# Patient Record
Sex: Female | Born: 1980 | Race: Black or African American | Hispanic: No | Marital: Married | Smoking: Current every day smoker
Health system: Southern US, Community
[De-identification: ages and names within clinical notes are randomized; demographics above are authoritative.]

## PROBLEM LIST (undated history)

## (undated) DIAGNOSIS — J45909 Unspecified asthma, uncomplicated: Secondary | ICD-10-CM

## (undated) DIAGNOSIS — G43909 Migraine, unspecified, not intractable, without status migrainosus: Secondary | ICD-10-CM

## (undated) DIAGNOSIS — F319 Bipolar disorder, unspecified: Secondary | ICD-10-CM

---

## 2017-02-21 ENCOUNTER — Encounter (HOSPITAL_COMMUNITY): Payer: Self-pay | Admitting: Emergency Medicine

## 2017-02-21 ENCOUNTER — Ambulatory Visit (HOSPITAL_COMMUNITY)
Admission: EM | Admit: 2017-02-21 | Discharge: 2017-02-21 | Disposition: A | Discharge status: Home / Self Care | Payer: Medicaid Other | Attending: Internal Medicine | Admitting: Internal Medicine

## 2017-02-21 ENCOUNTER — Ambulatory Visit (INDEPENDENT_AMBULATORY_CARE_PROVIDER_SITE_OTHER): Payer: Medicaid Other

## 2017-02-21 DIAGNOSIS — R0789 Other chest pain: Secondary | ICD-10-CM | POA: Diagnosis not present

## 2017-02-21 DIAGNOSIS — R0782 Intercostal pain: Secondary | ICD-10-CM | POA: Diagnosis not present

## 2017-02-21 DIAGNOSIS — R05 Cough: Secondary | ICD-10-CM

## 2017-02-21 DIAGNOSIS — S39012A Strain of muscle, fascia and tendon of lower back, initial encounter: Secondary | ICD-10-CM | POA: Diagnosis not present

## 2017-02-21 DIAGNOSIS — M5489 Other dorsalgia: Secondary | ICD-10-CM | POA: Diagnosis not present

## 2017-02-21 DIAGNOSIS — T148XXA Other injury of unspecified body region, initial encounter: Secondary | ICD-10-CM

## 2017-02-21 HISTORY — DX: Bipolar disorder, unspecified: F31.9

## 2017-02-21 HISTORY — DX: Unspecified asthma, uncomplicated: J45.909

## 2017-02-21 HISTORY — DX: Migraine, unspecified, not intractable, without status migrainosus: G43.909

## 2017-02-21 MED ORDER — CYCLOBENZAPRINE HCL 10 MG PO TABS: 10.0000 mg | ORAL_TABLET | Freq: Two times a day (BID) | ORAL | 0 refills | Status: DC | PRN

## 2017-02-21 NOTE — ED Triage Notes (Signed)
Pt sts back and left rib pain x 1 week; pt denies injury but worse with movement and cough

## 2017-03-27 NOTE — ED Provider Notes (Signed)
[facility]    CSN: [id number] Arrival date & time: 02/21/17  1154     History   Chief Complaint Chief Complaint  Patient presents with  . Back Pain    HPI Deanna Green is a 37 y.o. female.   Pt c/o back pain x1 week. Cannot recall injuring herself. Denies urinary sx, fever, N/V/D.       Past Medical History:  Diagnosis Date  . Asthma   . Bipolar disorder (HCC)   . Migraine     There are no active problems to display for this patient.   History reviewed. No pertinent surgical history.  OB History    No data available       Home Medications    Prior to Admission medications   Medication Sig Start Date End Date Taking? Authorizing Provider  cyclobenzaprine (FLEXERIL) 10 MG tablet Take 1 tablet (10 mg total) by mouth 3 times/day as needed-between meals & bedtime for muscle spasms. 02/21/17   Arnaldo Nataliamond, Ezana Hubbert S, MD    Family History History reviewed. No pertinent family history.  Social History Social History   Tobacco Use  . Smoking status: Current Every Day Smoker  . Smokeless tobacco: Never Used  Substance Use Topics  . Alcohol use: Yes  . Drug use: No     Allergies   Sulfa antibiotics   Review of Systems Review of Systems  Constitutional: Negative for chills and fever.  HENT: Negative for sore throat and tinnitus.   Eyes: Negative for redness.  Respiratory: Negative for cough and shortness of breath.   Cardiovascular: Negative for chest pain and palpitations.  Gastrointestinal: Negative for abdominal pain, diarrhea, nausea and vomiting.  Genitourinary: Negative for dysuria, frequency and urgency.  Musculoskeletal: Positive for back pain. Negative for myalgias.  Skin: Negative for rash.       No lesions  Neurological: Negative for weakness.  Hematological: Does not bruise/bleed easily.  Psychiatric/Behavioral: Negative for suicidal ideas.     Physical Exam Triage Vital Signs ED Triage Vitals [02/21/17 1220]  Enc Vitals  Group     BP (!) 120/99     Pulse Rate 87     Resp 18     Temp 98.8 F (37.1 C)     Temp Source Oral     SpO2 100 %     Weight      Height      Head Circumference      Peak Flow      Pain Score 10     Pain Loc      Pain Edu?      Excl. in GC?    No data found.  Updated Vital Signs BP (!) 120/99 (BP Location: Right Arm)   Pulse 87   Temp 98.8 F (37.1 C) (Oral)   Resp 18   SpO2 100%   Visual Acuity Right Eye Distance:   Left Eye Distance:   Bilateral Distance:    Right Eye Near:   Left Eye Near:    Bilateral Near:     Physical Exam  Constitutional: She appears well-developed and well-nourished. No distress.  HENT:  Head: Normocephalic and atraumatic.  Eyes: Conjunctivae are normal.  Neck: Neck supple.  Cardiovascular: Normal rate and regular rhythm.  No murmur heard. Pulmonary/Chest: Effort normal and breath sounds normal. No respiratory distress.  Abdominal: Soft. There is no tenderness.  Musculoskeletal: She exhibits no edema.  Back pain reproducible with movement  Neurological: She is alert.  Skin: Skin  is warm and dry.  Psychiatric: She has a normal mood and affect.  Nursing note and vitals reviewed.    UC Treatments / Results  Labs (all labs ordered are listed, but only abnormal results are displayed) Labs Reviewed - No data to display  EKG  EKG Interpretation None       Radiology No results found.  Procedures Procedures (including critical care time)  Medications Ordered in UC Medications - No data to display   Initial Impression / Assessment and Plan / UC Course  I have reviewed the triage vital signs and the nursing notes.  Pertinent labs & imaging results that were available during my care of the patient were reviewed by me and considered in my medical decision making (see chart for details).     Pain is musculoskeletal.   Final Clinical Impressions(s) / UC Diagnoses   Final diagnoses:  Muscle strain    ED Discharge  Orders        Ordered    cyclobenzaprine (FLEXERIL) 10 MG tablet  2 times daily between meals and at bedtime PRN     02/21/17 1429       Controlled Substance Prescriptions [place] Controlled Substance Registry consulted? Not Applicable   Arnaldo Natal, MD 03/27/17 1510

## 2018-04-26 ENCOUNTER — Encounter (HOSPITAL_COMMUNITY): Payer: Self-pay | Admitting: Emergency Medicine

## 2018-04-26 ENCOUNTER — Ambulatory Visit (HOSPITAL_COMMUNITY)
Admission: EM | Admit: 2018-04-26 | Discharge: 2018-04-26 | Disposition: A | Discharge status: Home / Self Care | Payer: Medicaid Other | Attending: Family Medicine | Admitting: Family Medicine

## 2018-04-26 ENCOUNTER — Ambulatory Visit (INDEPENDENT_AMBULATORY_CARE_PROVIDER_SITE_OTHER): Payer: Medicaid Other

## 2018-04-26 DIAGNOSIS — R1084 Generalized abdominal pain: Secondary | ICD-10-CM

## 2018-04-26 LAB — POCT URINALYSIS DIP (DEVICE)
Glucose, UA: NEGATIVE mg/dL
KETONES UR: NEGATIVE mg/dL
LEUKOCYTE UA: NEGATIVE
Nitrite: NEGATIVE
PH: 6.5 (ref 5.0–8.0)
Protein, ur: NEGATIVE mg/dL
Specific Gravity, Urine: 1.025 (ref 1.005–1.030)
Urobilinogen, UA: 0.2 mg/dL (ref 0.0–1.0)

## 2018-04-26 NOTE — ED Provider Notes (Signed)
[facility]    CSN: [id number] Arrival date & time: 04/26/18  1402     History   Chief Complaint Chief Complaint  Patient presents with  . Abdominal Pain    HPI Deanna Green is a 38 y.o. female.    Pt c/o generalized upper abdominal pain since Monday. States when she eats food it seems to make it worse.  Reports rolling sharp pain that transitions to a dull ache associated with constipation (decrease from 3 BM's to 2 BM's a day) and decreased PO intake since Monday. Denies fever/ chills, urinary symptoms. Reports that pain is getting better. 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   Also reports migraine HA that has been ongoing for 3 weeks, typically broken by sleep. She reports extensive history of migraines with multiple therapy types for same.        Past Medical History:  Diagnosis Date  . Asthma   . Bipolar disorder (HCC)   . Migraine     There are no active problems to display for this patient.   History reviewed. No pertinent surgical history.  OB History   No obstetric history on file.      Home Medications    Prior to Admission medications   Not on File    Family History No family history on file.  Social History Social History   Tobacco Use  . Smoking status: Current Every Day Smoker  . Smokeless tobacco: Never Used  Substance Use Topics  . Alcohol use: Yes  . Drug use: No     Allergies   Strawberry (diagnostic) and Sulfa antibiotics   Review of Systems Review of Systems  Constitutional: Positive for appetite change. Negative for chills, fatigue and fever.  Eyes: Negative for photophobia.  Respiratory: Negative for chest tightness and shortness of breath.   Cardiovascular: Negative for chest pain.  Gastrointestinal: Positive for abdominal pain and constipation. Negative for abdominal distention, diarrhea,  nausea, rectal pain and vomiting.  Endocrine: Negative.   Genitourinary: Negative.   Musculoskeletal: Positive for arthralgias and back pain.  Skin: Negative.   Neurological: Positive for headaches. Negative for dizziness, facial asymmetry and speech difficulty.     Physical Exam Triage Vital Signs ED Triage Vitals  Enc Vitals Group     BP 04/26/18 1515 131/86     Pulse Rate 04/26/18 1515 83     Resp 04/26/18 1515 20     Temp 04/26/18 1515 98.2 F (36.8 C)     Temp Source 04/26/18 1515 Oral     SpO2 04/26/18 1515 95 %     Weight --      Height --      Head Circumference --      Peak Flow --      Pain Score 04/26/18 1516 7     Pain Loc --      Pain Edu? --      Excl. in GC? --    No data found.  Updated Vital Signs BP 131/86 (BP Location: Left Arm)   Pulse 83   Temp 98.2 F (36.8 C) (Oral)   Resp 20   LMP 04/21/2018   SpO2 95%    Physical Exam Vitals signs and nursing note reviewed.  Constitutional:      General: She is in acute distress.     Appearance: She is well-developed.  HENT:  Mouth/Throat:     Mouth: Mucous membranes are moist.     Pharynx: Oropharynx is clear.  Eyes:     Extraocular Movements: Extraocular movements intact.     Pupils: Pupils are equal, round, and reactive to light.  Neck:     Musculoskeletal: Normal range of motion and neck supple.  Cardiovascular:     Rate and Rhythm: Normal rate and regular rhythm.     Heart sounds: Normal heart sounds.  Pulmonary:     Effort: Pulmonary effort is normal.     Breath sounds: Normal breath sounds.  Abdominal:     General: Bowel sounds are normal.     Palpations: Abdomen is soft. There is no mass or pulsatile mass.     Tenderness: There is abdominal tenderness in the right upper quadrant, epigastric area and left upper quadrant. There is guarding.     Comments: Guarding to RUQ and LUQ of abd. Pain with palpation to RUQ and LUQ that is similar on both sides.  Musculoskeletal:     Comments:  Had difficulty moving to exam table due to soreness  Skin:    General: Skin is warm and dry.  Neurological:     General: No focal deficit present.     Mental Status: She is alert and oriented to person, place, and time.  Psychiatric:        Mood and Affect: Mood normal.        Behavior: Behavior normal.      UC Treatments / Results  Labs (all labs ordered are listed, but only abnormal results are displayed) Labs Reviewed  POCT URINALYSIS DIP (DEVICE) - Abnormal; Notable for the following components:      Result Value   Bilirubin Urine SMALL (*)    Hgb urine dipstick MODERATE (*)    All other components within normal limits    EKG None  Radiology Dg Abd 1 View  Result Date: 04/26/2018 CLINICAL DATA:  38 y/o F; constipation, irregular bowel movement, epigastric pain for 3 days. EXAM: ABDOMEN - 1 VIEW COMPARISON:  None. FINDINGS: The bowel gas pattern is normal. No radio-opaque calculi or other significant radiographic abnormality are seen. IMPRESSION: Negative. Electronically Signed   By: Mitzi Hansen M.D.   On: 04/26/2018 16:15    Procedures Procedures (including critical care time)  Medications Ordered in UC Medications - No data to display  Initial Impression / Assessment and Plan / UC Course  I have reviewed the triage vital signs and the nursing notes.  Pertinent labs & imaging results that were available during my care of the patient were reviewed by me and considered in my medical decision making (see chart for details).    Final Clinical Impressions(s) / UC Diagnoses   Final diagnoses:  Generalized abdominal pain     Discharge Instructions     Your tests are not conclusive.  Therefore you need more evaluation down the emergency room where further testing can be done.  Please go down to the emergency room this afternoon for further testing.    ED Prescriptions    None     Controlled Substance Prescriptions [place] Controlled Substance  Registry consulted? Not Applicable   Elvina Sidle, MD 04/26/18 1620

## 2018-04-26 NOTE — Discharge Instructions (Signed)
Your tests are not conclusive.  Therefore you need more evaluation down the emergency room where further testing can be done.  Please go down to the emergency room this afternoon for further testing.

## 2018-04-26 NOTE — ED Triage Notes (Signed)
Pt c/o generalized upper abdominal pain since Monday. States when she eats food it seems to make it worse.

## 2018-04-27 ENCOUNTER — Other Ambulatory Visit: Payer: Self-pay

## 2018-04-27 ENCOUNTER — Inpatient Hospital Stay (HOSPITAL_COMMUNITY)
Admission: EM | Admit: 2018-04-27 | Discharge: 2018-04-30 | DRG: 439 | Disposition: A | Discharge status: Home / Self Care | Payer: Medicaid Other | Attending: Family Medicine | Admitting: Family Medicine

## 2018-04-27 ENCOUNTER — Encounter (HOSPITAL_COMMUNITY): Payer: Self-pay

## 2018-04-27 ENCOUNTER — Emergency Department (HOSPITAL_COMMUNITY): Payer: Medicaid Other

## 2018-04-27 DIAGNOSIS — Z91018 Allergy to other foods: Secondary | ICD-10-CM

## 2018-04-27 DIAGNOSIS — E781 Pure hyperglyceridemia: Secondary | ICD-10-CM | POA: Diagnosis present

## 2018-04-27 DIAGNOSIS — F1010 Alcohol abuse, uncomplicated: Secondary | ICD-10-CM

## 2018-04-27 DIAGNOSIS — F319 Bipolar disorder, unspecified: Secondary | ICD-10-CM | POA: Diagnosis present

## 2018-04-27 DIAGNOSIS — R51 Headache: Secondary | ICD-10-CM | POA: Diagnosis not present

## 2018-04-27 DIAGNOSIS — Z882 Allergy status to sulfonamides status: Secondary | ICD-10-CM

## 2018-04-27 DIAGNOSIS — F0630 Mood disorder due to known physiological condition, unspecified: Secondary | ICD-10-CM

## 2018-04-27 DIAGNOSIS — J45909 Unspecified asthma, uncomplicated: Secondary | ICD-10-CM | POA: Diagnosis present

## 2018-04-27 DIAGNOSIS — G43909 Migraine, unspecified, not intractable, without status migrainosus: Secondary | ICD-10-CM | POA: Diagnosis present

## 2018-04-27 DIAGNOSIS — D649 Anemia, unspecified: Secondary | ICD-10-CM | POA: Diagnosis present

## 2018-04-27 DIAGNOSIS — G8929 Other chronic pain: Secondary | ICD-10-CM

## 2018-04-27 DIAGNOSIS — F17210 Nicotine dependence, cigarettes, uncomplicated: Secondary | ICD-10-CM | POA: Diagnosis present

## 2018-04-27 DIAGNOSIS — K852 Alcohol induced acute pancreatitis without necrosis or infection: Principal | ICD-10-CM | POA: Diagnosis present

## 2018-04-27 DIAGNOSIS — K859 Acute pancreatitis without necrosis or infection, unspecified: Secondary | ICD-10-CM

## 2018-04-27 DIAGNOSIS — F10188 Alcohol abuse with other alcohol-induced disorder: Secondary | ICD-10-CM | POA: Diagnosis present

## 2018-04-27 DIAGNOSIS — F17200 Nicotine dependence, unspecified, uncomplicated: Secondary | ICD-10-CM

## 2018-04-27 LAB — COMPREHENSIVE METABOLIC PANEL
ALK PHOS: 39 U/L (ref 38–126)
ALT: 14 U/L (ref 0–44)
ANION GAP: 7 (ref 5–15)
AST: 19 U/L (ref 15–41)
Albumin: 3.4 g/dL — ABNORMAL LOW (ref 3.5–5.0)
BILIRUBIN TOTAL: 0.5 mg/dL (ref 0.3–1.2)
BUN: 5 mg/dL — ABNORMAL LOW (ref 6–20)
CALCIUM: 8.6 mg/dL — AB (ref 8.9–10.3)
CO2: 19 mmol/L — ABNORMAL LOW (ref 22–32)
Chloride: 109 mmol/L (ref 98–111)
Creatinine, Ser: 0.7 mg/dL (ref 0.44–1.00)
GFR calc non Af Amer: >60 mL/min (ref >60)
Glucose, Bld: 101 mg/dL — ABNORMAL HIGH (ref 70–99)
Potassium: 3.8 mmol/L (ref 3.5–5.1)
SODIUM: 135 mmol/L (ref 135–145)
TOTAL PROTEIN: 5.9 g/dL — AB (ref 6.5–8.1)

## 2018-04-27 LAB — URINALYSIS, ROUTINE W REFLEX MICROSCOPIC
Bacteria, UA: NONE SEEN
Bilirubin Urine: NEGATIVE
Glucose, UA: NEGATIVE mg/dL
Ketones, ur: NEGATIVE mg/dL
Leukocytes,Ua: NEGATIVE
Nitrite: NEGATIVE
Protein, ur: NEGATIVE mg/dL
SPECIFIC GRAVITY, URINE: 1.016 (ref 1.005–1.030)
pH: 6 (ref 5.0–8.0)

## 2018-04-27 LAB — CBC WITH DIFFERENTIAL/PLATELET
Abs Immature Granulocytes: 0.04 10*3/uL (ref 0.00–0.07)
BASOS PCT: 0 %
Basophils Absolute: 0 10*3/uL (ref 0.0–0.1)
EOS ABS: 0.5 10*3/uL (ref 0.0–0.5)
EOS PCT: 5 %
HCT: 33.2 % — ABNORMAL LOW (ref 36.0–46.0)
Hemoglobin: 10.7 g/dL — ABNORMAL LOW (ref 12.0–15.0)
Immature Granulocytes: 0 %
Lymphocytes Relative: 12 %
Lymphs Abs: 1.2 10*3/uL (ref 0.7–4.0)
MCH: 29.8 pg (ref 26.0–34.0)
MCHC: 32.2 g/dL (ref 30.0–36.0)
MCV: 92.5 fL (ref 80.0–100.0)
MONO ABS: 0.5 10*3/uL (ref 0.1–1.0)
Monocytes Relative: 5 %
Neutro Abs: 8.1 10*3/uL — ABNORMAL HIGH (ref 1.7–7.7)
Neutrophils Relative %: 78 %
PLATELETS: 291 10*3/uL (ref 150–400)
RBC: 3.59 MIL/uL — AB (ref 3.87–5.11)
RDW: 13.6 % (ref 11.5–15.5)
WBC: 10.4 10*3/uL (ref 4.0–10.5)
nRBC: 0 % (ref 0.0–0.2)

## 2018-04-27 LAB — RAPID URINE DRUG SCREEN, HOSP PERFORMED
AMPHETAMINES: NOT DETECTED
Barbiturates: NOT DETECTED
Benzodiazepines: NOT DETECTED
Cocaine: NOT DETECTED
OPIATES: NOT DETECTED
TETRAHYDROCANNABINOL: POSITIVE — AB

## 2018-04-27 LAB — ETHANOL: Alcohol, Ethyl (B): <10 mg/dL (ref <10)

## 2018-04-27 LAB — LIPASE, BLOOD: LIPASE: 1488 U/L — AB (ref 11–51)

## 2018-04-27 MED ORDER — ENOXAPARIN SODIUM 40 MG/0.4ML ~~LOC~~ SOLN
40.0000 mg | SUBCUTANEOUS | Status: DC
  Filled 2018-04-27 (×2): qty 0.4

## 2018-04-27 MED ORDER — VITAMIN B-1 100 MG PO TABS
100.0000 mg | ORAL_TABLET | Freq: Every day | ORAL | Status: DC
  Administered 2018-04-27 – 2018-04-30 (×4): 100 mg via ORAL
  Filled 2018-04-27 (×4): qty 1

## 2018-04-27 MED ORDER — SODIUM CHLORIDE 0.9 % IV SOLN
INTRAVENOUS | Status: DC
  Administered 2018-04-27 – 2018-04-30 (×7): via INTRAVENOUS

## 2018-04-27 MED ORDER — ADULT MULTIVITAMIN W/MINERALS CH
1.0000 | ORAL_TABLET | Freq: Every day | ORAL | Status: DC
  Administered 2018-04-28 – 2018-04-30 (×3): 1 via ORAL
  Filled 2018-04-27 (×3): qty 1

## 2018-04-27 MED ORDER — HYDROMORPHONE HCL 1 MG/ML IJ SOLN
0.5000 mg | INTRAMUSCULAR | Status: DC | PRN
  Administered 2018-04-27 – 2018-04-28 (×4): 0.5 mg via INTRAVENOUS
  Filled 2018-04-27 (×4): qty 1

## 2018-04-27 MED ORDER — ONDANSETRON HCL 4 MG/2ML IJ SOLN
4.0000 mg | Freq: Once | INTRAMUSCULAR | Status: AC
  Administered 2018-04-27: 4 mg via INTRAVENOUS
  Filled 2018-04-27: qty 2

## 2018-04-27 MED ORDER — FOLIC ACID 1 MG PO TABS
1.0000 mg | ORAL_TABLET | Freq: Every day | ORAL | Status: DC
  Administered 2018-04-27 – 2018-04-30 (×4): 1 mg via ORAL
  Filled 2018-04-27 (×4): qty 1

## 2018-04-27 MED ORDER — SODIUM CHLORIDE 0.9 % IV BOLUS
1000.0000 mL | Freq: Once | INTRAVENOUS | Status: AC
  Administered 2018-04-27: 1000 mL via INTRAVENOUS

## 2018-04-27 MED ORDER — SODIUM CHLORIDE 0.9 % IV SOLN
INTRAVENOUS | Status: AC
  Administered 2018-04-27 – 2018-04-28 (×2): via INTRAVENOUS

## 2018-04-27 MED ORDER — ONDANSETRON HCL 4 MG PO TABS: 4.0000 mg | ORAL_TABLET | Freq: Four times a day (QID) | ORAL | Status: DC | PRN

## 2018-04-27 MED ORDER — THIAMINE HCL 100 MG/ML IJ SOLN: 100.0000 mg | Freq: Every day | INTRAMUSCULAR | Status: DC

## 2018-04-27 MED ORDER — LORAZEPAM 2 MG/ML IJ SOLN: 1.0000 mg | Freq: Four times a day (QID) | INTRAMUSCULAR | Status: AC | PRN

## 2018-04-27 MED ORDER — LORAZEPAM 1 MG PO TABS
1.0000 mg | ORAL_TABLET | Freq: Four times a day (QID) | ORAL | Status: AC | PRN
  Administered 2018-04-28: 1 mg via ORAL
  Filled 2018-04-27: qty 1

## 2018-04-27 MED ORDER — ONDANSETRON HCL 4 MG/2ML IJ SOLN: 4.0000 mg | Freq: Four times a day (QID) | INTRAMUSCULAR | Status: DC | PRN

## 2018-04-27 MED ORDER — FENTANYL CITRATE (PF) 100 MCG/2ML IJ SOLN
100.0000 ug | INTRAMUSCULAR | Status: AC | PRN
  Administered 2018-04-27 (×2): 100 ug via INTRAVENOUS
  Filled 2018-04-27 (×2): qty 2

## 2018-04-27 NOTE — Progress Notes (Signed)
Pt new admit from ED for abdominal pain, alert and oriented , independent, NPO with IV NSS  fluid, no complain of pain at this time.

## 2018-04-27 NOTE — H&P (Addendum)
Family Medicine Teaching [facility] Admission History and Physical Service Pager: [phone]  Patient name: Deanna Green Medical record number: [id number] Date of birth: [birth year] Age: 38 y.o. Gender: female  Primary Care Provider: Patient, No Pcp Per Consultants: none Code Status: full  Chief Complaint: epigastric pain  Assessment and Plan: Deanna Green is a 38 y.o. female presenting with 5 days of epigastric pain and found to have and elevated lipase consistent with acute pancreatitis . PMH is significant for alcohol use disorder, migraines,  possible bipolar disorder.    Acute Alcoholic Pancreatitis - 5 day history of epigastric pain.  Decreased appetite.  No nausea, vomiting or diarrhea. Daily alcohol user. No previous history of biliary cholic symptoms.  Does not use NSAIDs regularly. Lipase is 1488.  AST/ALT normal.  Abdominal u/s showed no gallstones, possible hepatic steatosis, mild dilation of pancreatic duct without definite stone. Patient afebrile, no leukocytosis, and VSS.  Patient likely has acute pancreatitis secondary to alcohol use.  Other causes of pancreatitis such as stones, hypertriglyceridemia, or chronic NSAID use less likely based on History. Will admit and control pain and slowly advance diet. Patient has had reactions to morphine and percocet.    - admit to inpatient, med surg. Dr. Pollie Meyer attending.  - NPO then advance diet as tolerated.  - 119ml/hr NS mIVF -  Dilaudid 0.5 mg q3h PRN, transition to po on on day 2 - zofran prn  - AM CBC and BMP - AM lipase - vitals per routine  Alcohol use disorder - patient states she drinks 2 beers daily and liquor at least once a week. Cannot remember the last time she went more than one day without drinking.  High risk for having withdrawals.  Will monitor CIWAs - CIWA scores.  Treat with ativan as stated in orders.  - thiamine daily - folic acid daily - consider social work consult  Chronic daily headaches - migraine -  patient states she has had daily migraines for 3 weeks.  Usually gets them for a week or two at a time before having a few days without.  No longer takes medicine for them.   - patient getting IV pain medication currently, although some opioids have worsened her headaches in the past.  - consider tylenol when patient is no longer NPO.   --outpatient neurology follow up  Possible Bipolar disorder - listed in her problem list.  Not mentioned by patient.  Is not currently taking medication for them.   - monitor patient's mood.   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  This is a new symptom for her starting today.  There is lateral movement of her jaw when opening and closing her mouth but no clicking, locking or trismus noted on exam. Will continue to monitor.   FEN/GI: NPO. 150ml/hr NS Prophylaxis: lovenox  Disposition: med-surg  History of Present Illness:  Deanna Green is a 38 y.o. female presenting with 5 days of epigastric abdominal pain.    The patient states she has had abdominal pain that goes from her 'belly button to her ribs' since Monday.  This pain has become worse since then.  It has been constant.  It fluctuates between and 8 and 10 out of 10 for pain.  Sometimes it radiates to her back.  It hurts when she doesn't eat worse than when she does eat. She has continued to eat, but is eating less than usual, about one meal a day for  this past week. There is no nausea or vomiting.  She went to the urgent care yesterday but she said they told her there was nothing they could do for her and that she should come to the emergency room.  She has never had pain like this before.  She does not experience RUQ after eating meals.  She smokes 1/2 pack per day and she drinks 'two beers' daily and will split a fifth of liquor with her husband and mom at least once a week, whom she lives with along with her three children.  She does not remember the last time she went more  than one day without consuming alcohol.  She does not do drugs.    The patient also currently has a migraine headache that she states she's had every day for three weeks.  They usually only last a week or two before getting a few days without headache. She does not take any medication for her headaches anymore.  She will usually use sleep to get rid of them.       Review Of Systems: Per HPI with the following additions:   Review of Systems  Constitutional: Negative for chills and fever.  Respiratory: Negative for cough.   Cardiovascular: Negative for chest pain.  Gastrointestinal: Positive for abdominal pain. Negative for diarrhea, heartburn, nausea and vomiting.  Genitourinary: Negative for dysuria.  Musculoskeletal: Positive for back pain.  Skin: Negative for rash.  Neurological: Positive for headaches. Negative for dizziness.  Psychiatric/Behavioral: Positive for substance abuse.    Patient Active Problem List   Diagnosis Date Noted  . Pancreatitis 04/27/2018    Past Medical History: Past Medical History:  Diagnosis Date  . Asthma   . Bipolar disorder (HCC)   . Migraine     Past Surgical History: History reviewed. No pertinent surgical history.  Social History: Social History   Tobacco Use  . Smoking status: Current Every Day Smoker  . Smokeless tobacco: Never Used  Substance Use Topics  . Alcohol use: Yes  . Drug use: No   Additional social history:   Please also refer to relevant sections of EMR.  Family History: No family history on file.  Allergies and Medications: Allergies  Allergen Reactions  . Strawberry (Diagnostic) Hives  . Sulfa Antibiotics Nausea Only    Upset stomach   No current facility-administered medications on file prior to encounter.    Current Outpatient Medications on File Prior to Encounter  Medication Sig Dispense Refill  . diphenhydramine-acetaminophen (TYLENOL PM) 25-500 MG TABS tablet Take 1 tablet by mouth at bedtime as  needed.      Objective: BP 125/86 (BP Location: Right Arm)   Pulse 70   Temp 98.8 F (37.1 C) (Oral)   Resp 18   LMP 04/21/2018   SpO2 100%  Exam: General: alert and oriented.  Moderate pain and discomfort Eyes: PERRL. EOMI.  No scleral icterus.  ENTM: moist oral mucosa. No oropharyngeal erythema. Jaw pain when she opens her mouth.  Neck: no cervical LAD.  No thyromegaly.  Cardiovascular: regular rhythm. Normal rate. No murmurs.   Respiratory: lungs clear to auscultation bilaterally. No wheezes or crackles.  Gastrointestinal: very Tender to palpation epigatrically. Reduced bowel sounds.    Derm: no rashes. Skin warm and dry.  Neuro: cranial nerves grossly intact.  Strength equal bilaterally.   Psych: pleasant affect. Makes eye contact.    Labs and Imaging: CBC BMET  Recent Labs  Lab 04/27/18 1216  WBC 10.4  HGB  10.7*  HCT 33.2*  PLT 291   Recent Labs  Lab 04/27/18 1216  NA 135  K 3.8  CL 109  CO2 19*  BUN 5*  CREATININE 0.70  GLUCOSE 101*  CALCIUM 8.6*     US Abdomen Complete  Result Date: 04/27/2018 CLINICAL DATA:  Abdominal pain for 1 week. EXAM: ABDOMEN ULTRASOUND COMPLETE COMPARISON:  Abdomen radiographs from 04/26/2018 FINDINGS: Gallbladder: No gallstones or wall thickening visualized. No sonographic Murphy sign noted by sonographer. Common bile duct: Diameter: 6.2 mm Liver: No focal lesion identified. Mild increase in hepatic echogenicity may represent hepatic steatosis. Portal vein is patent on color Doppler imaging with normal direction of blood flow towards the liver. IVC: No abnormality visualized. Pancreas: Mild ectasia of the pancreatic duct to 4.9 mm without definite stone nor apparent pancreatic mass. The head of the pancreas is obscured by bowel gas. Spleen: Size and appearance within normal limits. Right Kidney: Length: 12.1 cm. Echogenicity within normal limits. No mass or hydronephrosis visualized. Left Kidney: Length: 11.1 cm. Echogenicity within  normal limits. No mass or hydronephrosis visualized. Abdominal aorta: No aneurysm visualized. Other findings: None. IMPRESSION: 1. No sonographic evidence for the patient's pain. 2. Mild ectasia of the pancreatic duct. 3. Echogenic liver parenchyma can be seen with steatosis. Electronically Signed   By: Tollie Eth M.D.   On: 04/27/2018 14:13    I have seen and evaluated the patient with Dr. Constance Goltz. I am in agreement with the note above in its revised form. My additions are in blue.  Lovena Neighbours, MD Family Medicine, PGY-3    Sandre Kitty, MD 04/27/2018, 5:41 PM PGY-1, [facility] Family Medicine FPTS Intern pager: [phone], text pages welcome

## 2018-04-27 NOTE — ED Triage Notes (Signed)
Pt presents with c/o generalized abdominal pain since Monday. Pt states she was seen at urgent care yesterday, states they performed an xray and urinalysis but both were negative. Pt denies NVD, states her bowel movements have not been "normal". Pt endorses taking tylenol pm for sleep but w/o relief of pain. Pt states pain worsens when she eats as well as when she doesn't. States pain worsens when she takes a deep breath.

## 2018-04-27 NOTE — ED Provider Notes (Signed)
[facility] Provider Note   CSN: [id number] Arrival date & time: 04/27/18  1111     History   Chief Complaint Chief Complaint  Patient presents with  . Abdominal Pain    HPI Deanna Green is a 38 y.o. female.  HPI   She presents for evaluation of abdominal pain.  She went to the urgent care yesterday and was instructed to go to the ED but did not.  She is having trouble having a bowel movement for several days.  She ate well yesterday but could not eat yet today.  When she tries to eat the pain gets worse.  He denies fever, chills, vomiting, diarrhea or dizziness.  No prior similar problems.  There are no other known modifying factors.  Past Medical History:  Diagnosis Date  . Asthma   . Bipolar disorder (HCC)   . Migraine     There are no active problems to display for this patient.   History reviewed. No pertinent surgical history.   OB History   No obstetric history on file.      Home Medications    Prior to Admission medications   Medication Sig Start Date End Date Taking? Authorizing Provider  diphenhydramine-acetaminophen (TYLENOL PM) 25-500 MG TABS tablet Take 1 tablet by mouth at bedtime as needed.   Yes [provider]    Family History No family history on file.  Social History Social History   Tobacco Use  . Smoking status: Current Every Day Smoker  . Smokeless tobacco: Never Used  Substance Use Topics  . Alcohol use: Yes  . Drug use: No     Allergies   Strawberry (diagnostic) and Sulfa antibiotics   Review of Systems Review of Systems  All other systems reviewed and are negative.    Physical Exam Updated Vital Signs BP (!) 129/97   Pulse 91   Temp 98.4 F (36.9 C) (Oral)   Resp 18   LMP 04/21/2018   SpO2 99%   Physical Exam Vitals signs and nursing note reviewed.  Constitutional:      General: She is in acute distress (Uncomfortable).     Appearance: She is well-developed.  She is obese. She is ill-appearing. She is not diaphoretic.  HENT:     Head: Normocephalic and atraumatic.     Right Ear: External ear normal.     Left Ear: External ear normal.  Eyes:     Conjunctiva/sclera: Conjunctivae normal.     Pupils: Pupils are equal, round, and reactive to light.  Neck:     Musculoskeletal: Normal range of motion and neck supple.     Trachea: Phonation normal.  Cardiovascular:     Rate and Rhythm: Normal rate and regular rhythm.     Heart sounds: Normal heart sounds.  Pulmonary:     Effort: Pulmonary effort is normal.     Breath sounds: Normal breath sounds.  Abdominal:     General: There is no distension.     Palpations: Abdomen is soft. There is no mass.     Tenderness: There is abdominal tenderness (Epigastric, moderate). There is no right CVA tenderness, left CVA tenderness or guarding.     Hernia: No hernia is present.  Musculoskeletal: Normal range of motion.  Skin:    General: Skin is warm and dry.  Neurological:     Mental Status: She is alert and oriented to person, place, and time.     Cranial Nerves: No cranial nerve  deficit.     Sensory: No sensory deficit.     Motor: No abnormal muscle tone.     Coordination: Coordination normal.  Psychiatric:        Mood and Affect: Mood normal.        Behavior: Behavior normal.      ED Treatments / Results  Labs (all labs ordered are listed, but only abnormal results are displayed) Labs Reviewed  COMPREHENSIVE METABOLIC PANEL - Abnormal; Notable for the following components:      Result Value   CO2 19 (*)    Glucose, Bld 101 (*)    BUN 5 (*)    Calcium 8.6 (*)    Total Protein 5.9 (*)    Albumin 3.4 (*)    All other components within normal limits  LIPASE, BLOOD - Abnormal; Notable for the following components:   Lipase 1,488 (*)    All other components within normal limits  CBC WITH DIFFERENTIAL/PLATELET - Abnormal; Notable for the following components:   RBC 3.59 (*)    Hemoglobin 10.7  (*)    HCT 33.2 (*)    Neutro Abs 8.1 (*)    All other components within normal limits  URINALYSIS, ROUTINE W REFLEX MICROSCOPIC - Abnormal; Notable for the following components:   Hgb urine dipstick MODERATE (*)    All other components within normal limits  RAPID URINE DRUG SCREEN, HOSP PERFORMED - Abnormal; Notable for the following components:   Tetrahydrocannabinol POSITIVE (*)    All other components within normal limits  ETHANOL    EKG None  Radiology Dg Abd 1 View  Result Date: 04/26/2018 CLINICAL DATA:  38 y/o F; constipation, irregular bowel movement, epigastric pain for 3 days. EXAM: ABDOMEN - 1 VIEW COMPARISON:  None. FINDINGS: The bowel gas pattern is normal. No radio-opaque calculi or other significant radiographic abnormality are seen. IMPRESSION: Negative. Electronically Signed   By: Mitzi Hansen M.D.   On: 04/26/2018 16:15   US Abdomen Complete  Result Date: 04/27/2018 CLINICAL DATA:  Abdominal pain for 1 week. EXAM: ABDOMEN ULTRASOUND COMPLETE COMPARISON:  Abdomen radiographs from 04/26/2018 FINDINGS: Gallbladder: No gallstones or wall thickening visualized. No sonographic Murphy sign noted by sonographer. Common bile duct: Diameter: 6.2 mm Liver: No focal lesion identified. Mild increase in hepatic echogenicity may represent hepatic steatosis. Portal vein is patent on color Doppler imaging with normal direction of blood flow towards the liver. IVC: No abnormality visualized. Pancreas: Mild ectasia of the pancreatic duct to 4.9 mm without definite stone nor apparent pancreatic mass. The head of the pancreas is obscured by bowel gas. Spleen: Size and appearance within normal limits. Right Kidney: Length: 12.1 cm. Echogenicity within normal limits. No mass or hydronephrosis visualized. Left Kidney: Length: 11.1 cm. Echogenicity within normal limits. No mass or hydronephrosis visualized. Abdominal aorta: No aneurysm visualized. Other findings: None. IMPRESSION: 1. No  sonographic evidence for the patient's pain. 2. Mild ectasia of the pancreatic duct. 3. Echogenic liver parenchyma can be seen with steatosis. Electronically Signed   By: Tollie Eth M.D.   On: 04/27/2018 14:13    Procedures .Critical Care Performed by: Mancel Bale, MD Authorized by: Mancel Bale, MD   Critical care provider statement:    Critical care time (minutes):  35   Critical care start time:  04/27/2018 11:35 AM   Critical care end time:  04/27/2018 3:16 PM   Critical care time was exclusive of:  Separately billable procedures and treating other patients   Critical care  was time spent personally by me on the following activities:  Blood draw for specimens, development of treatment plan with patient or surrogate, discussions with consultants, evaluation of patient's response to treatment, examination of patient, obtaining history from patient or surrogate, ordering and performing treatments and interventions, ordering and review of laboratory studies, pulse oximetry, re-evaluation of patient's condition, review of old charts and ordering and review of radiographic studies   (including critical care time)  Medications Ordered in ED Medications  0.9 %  sodium chloride infusion ( Intravenous New Bag/Given 04/27/18 1216)  fentaNYL (SUBLIMAZE) injection 100 mcg (100 mcg Intravenous Given 04/27/18 1212)  sodium chloride 0.9 % bolus 1,000 mL (1,000 mLs Intravenous New Bag/Given 04/27/18 1214)  ondansetron (ZOFRAN) injection 4 mg (4 mg Intravenous Given 04/27/18 1213)     Initial Impression / Assessment and Plan / ED Course  I have reviewed the triage vital signs and the nursing notes.  Pertinent labs & imaging results that were available during my care of the patient were reviewed by me and considered in my medical decision making (see chart for details).  Clinical Course as of Apr 27 1508  Fri Apr 27, 2018  1507 Normal except THC present  Urine rapid drug screen (hosp performed)(!)  [EW]  1509 Markedly elevated  Lipase, blood(!) [EW]  1509 Normal except CO2 low, glucose high calcium low, total protein low, albumin low  Comprehensive metabolic panel(!) [EW]  1509 Normal except hemoglobin elevated  Urinalysis, Routine w reflex microscopic(!) [EW]  1509 Normal except hemoglobin low  CBC with Differential(!) [EW]    Clinical Course User Index [EW] Mancel Bale, MD     Patient Vitals for the past 24 hrs:  BP Temp Temp src Pulse Resp SpO2  04/27/18 1136 (!) 129/97 98.4 F (36.9 C) Oral 91 18 99 %    3:12 PM Reevaluation with update and discussion. After initial assessment and treatment, an updated evaluation reveals she remains uncomfortable, is tearful.  She has not had any vomiting.Mancel Bale   Medical Decision Making: Abdominal pain consistent with acute pancreatitis.  Patient will require admission for bowel rest and ongoing treatment of severe upper abdominal pain.  Doubt serious bacterial infection, metabolic instability or impending vascular collapse.  CRITICAL CARE-yes Performed by: Mancel Bale   Nursing Notes Reviewed/ Care Coordinated Applicable Imaging Reviewed Interpretation of Laboratory Data incorporated into ED treatment   3:15 PM-Consult complete with on-call family medicine resident. Patient case explained and discussed.  He agrees to admit patient for further evaluation and treatment. Call ended at 3:32 PM  Plan: Admit   Final Clinical Impressions(s) / ED Diagnoses   Final diagnoses:  None    ED Discharge Orders    None       Mancel Bale, MD 04/27/18 [phone]

## 2018-04-28 DIAGNOSIS — F17200 Nicotine dependence, unspecified, uncomplicated: Secondary | ICD-10-CM

## 2018-04-28 DIAGNOSIS — K859 Acute pancreatitis without necrosis or infection, unspecified: Secondary | ICD-10-CM

## 2018-04-28 DIAGNOSIS — F0630 Mood disorder due to known physiological condition, unspecified: Secondary | ICD-10-CM

## 2018-04-28 DIAGNOSIS — R51 Headache: Secondary | ICD-10-CM

## 2018-04-28 DIAGNOSIS — F1010 Alcohol abuse, uncomplicated: Secondary | ICD-10-CM

## 2018-04-28 DIAGNOSIS — E781 Pure hyperglyceridemia: Secondary | ICD-10-CM

## 2018-04-28 DIAGNOSIS — G8929 Other chronic pain: Secondary | ICD-10-CM

## 2018-04-28 LAB — CBC
HCT: 32.1 % — ABNORMAL LOW (ref 36.0–46.0)
Hemoglobin: 10.3 g/dL — ABNORMAL LOW (ref 12.0–15.0)
MCH: 29.8 pg (ref 26.0–34.0)
MCHC: 32.1 g/dL (ref 30.0–36.0)
MCV: 92.8 fL (ref 80.0–100.0)
Platelets: 172 10*3/uL (ref 150–400)
RBC: 3.46 MIL/uL — AB (ref 3.87–5.11)
RDW: 13.6 % (ref 11.5–15.5)
WBC: 7.9 10*3/uL (ref 4.0–10.5)
nRBC: 0 % (ref 0.0–0.2)

## 2018-04-28 LAB — LIPID PANEL
Cholesterol: 191 mg/dL (ref 0–200)
HDL: 34 mg/dL — ABNORMAL LOW (ref >40)
LDL Cholesterol: UNDETERMINED mg/dL (ref 0–99)
Total CHOL/HDL Ratio: 5.6 RATIO
Triglycerides: 448 mg/dL — ABNORMAL HIGH (ref <150)
VLDL: UNDETERMINED mg/dL (ref 0–40)

## 2018-04-28 LAB — BASIC METABOLIC PANEL
Anion gap: 8 (ref 5–15)
BUN: <5 mg/dL — ABNORMAL LOW (ref 6–20)
CO2: 20 mmol/L — ABNORMAL LOW (ref 22–32)
Calcium: 8 mg/dL — ABNORMAL LOW (ref 8.9–10.3)
Chloride: 109 mmol/L (ref 98–111)
Creatinine, Ser: 0.77 mg/dL (ref 0.44–1.00)
GFR calc Af Amer: >60 mL/min (ref >60)
Glucose, Bld: 104 mg/dL — ABNORMAL HIGH (ref 70–99)
POTASSIUM: 3.5 mmol/L (ref 3.5–5.1)
Sodium: 137 mmol/L (ref 135–145)

## 2018-04-28 LAB — TSH: TSH: 1.461 u[IU]/mL (ref 0.350–4.500)

## 2018-04-28 LAB — LIPASE, BLOOD: Lipase: 860 U/L — ABNORMAL HIGH (ref 11–51)

## 2018-04-28 MED ORDER — HYDROMORPHONE HCL 2 MG PO TABS: 4.0000 mg | ORAL_TABLET | ORAL | Status: DC | PRN

## 2018-04-28 MED ORDER — HYDROMORPHONE HCL 2 MG PO TABS
3.0000 mg | ORAL_TABLET | ORAL | Status: DC | PRN
  Administered 2018-04-28 – 2018-04-29 (×3): 3 mg via ORAL
  Filled 2018-04-28 (×3): qty 2

## 2018-04-28 MED ORDER — OXYCODONE HCL 5 MG PO TABS: 10.0000 mg | ORAL_TABLET | ORAL | Status: DC | PRN

## 2018-04-28 MED ORDER — OXYCODONE HCL 5 MG PO TABS: 5.0000 mg | ORAL_TABLET | ORAL | Status: DC | PRN

## 2018-04-28 MED ORDER — HYDROMORPHONE HCL 1 MG/ML IJ SOLN
0.5000 mg | INTRAMUSCULAR | Status: AC
  Administered 2018-04-28: 0.5 mg via INTRAVENOUS
  Filled 2018-04-28: qty 1

## 2018-04-28 NOTE — Progress Notes (Addendum)
Family Medicine Teaching Service Daily Progress Note Intern Pager: [phone]  Patient name: Deanna Green Medical record number: [id number] Date of birth: 10-Sep-1980 Age: 38 y.o. Gender: female  Primary Care Provider: Patient, No Pcp Per Consultants: none Code Status: full  Pt Overview and Major Events to Date:  Admission Date 04/27/2018  Hospital Day: 1 day   Assessment and Plan: Deanna Green is a 38 y.o. female presenting with 5 days of epigastric pain and found to have and elevated lipase consistent with acute pancreatitis . PMH is significant for alcohol use disorder, migraines,  possible bipolar disorder.    Acute Alcoholic Pancreatitis, improving Lipase down trending 1488 > 860.  Still NPO. Pain improved. Past 24 hrs, has used 200 mcg of IV  Fentanyl, 0.5 of IV dilaudid = ~ 40 ME (PO). Triglycerides elevated at 448, but not in range to typically cause of pancreatitis.  - 146ml/hr NS mIVF - transition to PO oxycodone for pain, 5-10 mg IR q4h prn - zofran prn  - vitals per routine  Hypertriglyceridemia Likely due to alcohol. - Will assess further with TSH, A1C.   Alcohol use disorder - O/n, CIWA 5,5, 2 . EtOH neg on admit. SW to provide resources for substance use disorder assistance.  - CIWA protocol,  - thiamine daily - folic acid daily  Anemia Hg 10.3.  - will get anemia panel  Chronic, but stable medical conditions  Chronic daily headaches vs. Migraine,  Not endorsed this AM.  - pain management as above - outpatient neurology follow up  Possible Bipolar disorder - monitor patient's mood.   Jaw pain - possible TMJ syndrome  - pain mgmt as above - consider outpatient dental referral   FEN/GI: ADAT to clears PPx: LMWH  Disposition: med-surge  Subjective:  Pain is improved today. Patient is ambulating spontaneously to the restroom on her own. She is hungry and endorsing wanting crackers.   Objective: Temp:  [97.7 F (36.5 C)-99 F (37.2 C)] 97.7 F  (36.5 C) (02/15 0447) Pulse Rate:  [68-91] 68 (02/15 0447) Resp:  [16-18] 17 (02/15 0447) BP: (110-129)/(72-97) 126/81 (02/15 0447) SpO2:  [97 %-100 %] 100 % (02/15 0447) Physical Exam: General: nad, standing beside bed Cardiovascular: rrr, no mrg Respiratory: ctab, nwob Abdomen: epigastric tenderness, nondistended Extremities: moves spontaneously, no edema Skin: multiple tatoos on left arm as a sleeve   Laboratory: Recent Labs  Lab 04/27/18 1216 04/28/18 0349  WBC 10.4 7.9  HGB 10.7* 10.3*  HCT 33.2* 32.1*  PLT 291 172   Recent Labs  Lab 04/27/18 1216 04/28/18 0349  NA 135 137  K 3.8 3.5  CL 109 109  CO2 19* 20*  BUN 5* <5*  CREATININE 0.70 0.77  CALCIUM 8.6* 8.0*  PROT 5.9*  --   BILITOT 0.5  --   ALKPHOS 39  --   ALT 14  --   AST 19  --   GLUCOSE 101* 104*      Imaging/Diagnostic Tests: US Abdomen Complete  Result Date: 04/27/2018 CLINICAL DATA:  Abdominal pain for 1 week. EXAM: ABDOMEN ULTRASOUND COMPLETE COMPARISON:  Abdomen radiographs from 04/26/2018 FINDINGS: Gallbladder: No gallstones or wall thickening visualized. No sonographic Murphy sign noted by sonographer. Common bile duct: Diameter: 6.2 mm Liver: No focal lesion identified. Mild increase in hepatic echogenicity may represent hepatic steatosis. Portal vein is patent on color Doppler imaging with normal direction of blood flow towards the liver. IVC: No abnormality visualized. Pancreas: Mild ectasia of the pancreatic duct to 4.9  mm without definite stone nor apparent pancreatic mass. The head of the pancreas is obscured by bowel gas. Spleen: Size and appearance within normal limits. Right Kidney: Length: 12.1 cm. Echogenicity within normal limits. No mass or hydronephrosis visualized. Left Kidney: Length: 11.1 cm. Echogenicity within normal limits. No mass or hydronephrosis visualized. Abdominal aorta: No aneurysm visualized. Other findings: None. IMPRESSION: 1. No sonographic evidence for the patient's  pain. 2. Mild ectasia of the pancreatic duct. 3. Echogenic liver parenchyma can be seen with steatosis. Electronically Signed   By: Tollie Eth M.D.   On: 04/27/2018 14:13    Garnette Gunner, MD 04/28/2018, 9:28 AM PGY-2, [facility] FPTS Intern pager: [phone], text pages welcome

## 2018-04-29 LAB — FOLATE: Folate: 22.9 ng/mL (ref >5.9)

## 2018-04-29 LAB — HIV ANTIBODY (ROUTINE TESTING W REFLEX): HIV Screen 4th Generation wRfx: NONREACTIVE

## 2018-04-29 LAB — CBC
HCT: 32.6 % — ABNORMAL LOW (ref 36.0–46.0)
Hemoglobin: 10.1 g/dL — ABNORMAL LOW (ref 12.0–15.0)
MCH: 29.3 pg (ref 26.0–34.0)
MCHC: 31 g/dL (ref 30.0–36.0)
MCV: 94.5 fL (ref 80.0–100.0)
Platelets: 247 10*3/uL (ref 150–400)
RBC: 3.45 MIL/uL — ABNORMAL LOW (ref 3.87–5.11)
RDW: 13.5 % (ref 11.5–15.5)
WBC: 6.5 10*3/uL (ref 4.0–10.5)
nRBC: 0 % (ref 0.0–0.2)

## 2018-04-29 LAB — BASIC METABOLIC PANEL
Anion gap: 6 (ref 5–15)
BUN: <5 mg/dL — ABNORMAL LOW (ref 6–20)
CHLORIDE: 112 mmol/L — AB (ref 98–111)
CO2: 20 mmol/L — ABNORMAL LOW (ref 22–32)
CREATININE: 0.53 mg/dL (ref 0.44–1.00)
Calcium: 7.9 mg/dL — ABNORMAL LOW (ref 8.9–10.3)
GFR calc Af Amer: >60 mL/min (ref >60)
GFR calc non Af Amer: >60 mL/min (ref >60)
Glucose, Bld: 104 mg/dL — ABNORMAL HIGH (ref 70–99)
Potassium: 4.1 mmol/L (ref 3.5–5.1)
Sodium: 138 mmol/L (ref 135–145)

## 2018-04-29 LAB — RETICULOCYTES
Immature Retic Fract: 24.5 % — ABNORMAL HIGH (ref 2.3–15.9)
RBC.: 3.45 MIL/uL — ABNORMAL LOW (ref 3.87–5.11)
Retic Count, Absolute: 117.3 10*3/uL (ref 19.0–186.0)
Retic Ct Pct: 3.4 % — ABNORMAL HIGH (ref 0.4–3.1)

## 2018-04-29 LAB — IRON AND TIBC
Iron: 28 ug/dL (ref 28–170)
Saturation Ratios: 8 % — ABNORMAL LOW (ref 10.4–31.8)
TIBC: 365 ug/dL (ref 250–450)
UIBC: 337 ug/dL

## 2018-04-29 LAB — VITAMIN B12: Vitamin B-12: 343 pg/mL (ref 180–914)

## 2018-04-29 LAB — FERRITIN: Ferritin: 20 ng/mL (ref 11–307)

## 2018-04-29 MED ORDER — ACETAMINOPHEN 325 MG PO TABS
650.0000 mg | ORAL_TABLET | Freq: Four times a day (QID) | ORAL | Status: DC
  Administered 2018-04-29 – 2018-04-30 (×6): 650 mg via ORAL
  Filled 2018-04-29 (×7): qty 2

## 2018-04-29 MED ORDER — HYDROMORPHONE HCL 2 MG PO TABS
1.0000 mg | ORAL_TABLET | ORAL | Status: DC | PRN
  Administered 2018-04-29 – 2018-04-30 (×7): 1 mg via ORAL
  Filled 2018-04-29 (×7): qty 1

## 2018-04-29 MED ORDER — HYDROMORPHONE HCL 2 MG PO TABS
2.0000 mg | ORAL_TABLET | ORAL | Status: DC | PRN
  Administered 2018-04-29: 2 mg via ORAL
  Filled 2018-04-29: qty 1

## 2018-04-29 NOTE — Discharge Summary (Signed)
Family Medicine Teaching [facility] Discharge Summary  Patient name: Deanna Green Medical record number: [id number] Date of birth: 02-22-1981 Age: 38 y.o. Gender: female Date of Admission: 04/27/2018  Date of Discharge: 04/30/2018 Admitting Physician: Latrelle Dodrill, MD  Primary Care Provider: Patient, No Pcp Per Consultants: Case management, social work  Indication for Hospitalization: Epigastric pain  Discharge Diagnoses/Problem List:  Acute pancreatitis Alcohol use disorder Migraines Possible bipolar disorder Anemia  Disposition: Charge home  Discharge Condition: Improved, stable  Discharge Exam:  Physical Exam Constitutional:      Appearance: She is well-developed.  Cardiovascular:     Rate and Rhythm: Normal rate and regular rhythm.     Heart sounds: Normal heart sounds.  Pulmonary:     Effort: Pulmonary effort is normal.     Breath sounds: Normal breath sounds.  Abdominal:     General: Bowel sounds are normal.     Palpations: Abdomen is soft. There is no mass.     Tenderness: There is abdominal tenderness in the epigastric area. There is no rebound.  Neurological:     General: No focal deficit present.     Mental Status: She is alert.   Brief Hospital Course:  The patient presented with several days of upper abdominal pain and was found to have lipase of nearly 1500 on admission consistent with acute pancreatitis.  The patient was placed on bowel rest, hydrated, and pain was controlled with Dilaudid and transitioned to p.o oxycodone.  Lipid panel was drawn showing hypertriglyceridemia to the 400s.  Hypertriglyceridemia was further assessed by checking TSH (found to be normal) and A1c (found to be normal).  Due to the patient's history of alcohol use disorder, she was placed on CIWA precautions and given thiamine and folic acid.  Throughout her hospitalization her pain improved, her lipase trended down, and the patient was better able to tolerate p.o. intake.  She  was medically cleared and discharged on 2/17 with close outpatient follow-up.  Issues for Follow Up:  1.  Recheck triglycerides after episode of pancreatitis resolves, patient had hypertriglyceridemia and patient.   2.  Hemoglobin A1c 4.9%.  Significant Procedures: None  Significant Labs and Imaging:  Recent Labs  Lab 04/28/18 0349 04/29/18 0238 04/30/18 0545  WBC 7.9 6.5 6.6  HGB 10.3* 10.1* 10.1*  HCT 32.1* 32.6* 32.7*  PLT 172 247 311   Recent Labs  Lab 04/27/18 1216 04/28/18 0349 04/29/18 0238 04/30/18 0545  NA 135 137 138 138  K 3.8 3.5 4.1 3.7  CL 109 109 112* 110  CO2 19* 20* 20* 19*  GLUCOSE 101* 104* 104* 106*  BUN 5* <5* <5* <5*  CREATININE 0.70 0.77 0.53 0.66  CALCIUM 8.6* 8.0* 7.9* 8.0*  ALKPHOS 39  --   --   --   AST 19  --   --   --   ALT 14  --   --   --   ALBUMIN 3.4*  --   --   --     US Abdomen Complete  Result Date: 04/27/2018 CLINICAL DATA:  Abdominal pain for 1 week. EXAM: ABDOMEN ULTRASOUND COMPLETE COMPARISON:  Abdomen radiographs from 04/26/2018 FINDINGS: Gallbladder: No gallstones or wall thickening visualized. No sonographic Murphy sign noted by sonographer. Common bile duct: Diameter: 6.2 mm Liver: No focal lesion identified. Mild increase in hepatic echogenicity may represent hepatic steatosis. Portal vein is patent on color Doppler imaging with normal direction of blood flow towards the liver. IVC: No abnormality visualized. Pancreas:  Mild ectasia of the pancreatic duct to 4.9 mm without definite stone nor apparent pancreatic mass. The head of the pancreas is obscured by bowel gas. Spleen: Size and appearance within normal limits. Right Kidney: Length: 12.1 cm. Echogenicity within normal limits. No mass or hydronephrosis visualized. Left Kidney: Length: 11.1 cm. Echogenicity within normal limits. No mass or hydronephrosis visualized. Abdominal aorta: No aneurysm visualized. Other findings: None. IMPRESSION: 1. No sonographic evidence for the  patient's pain. 2. Mild ectasia of the pancreatic duct. 3. Echogenic liver parenchyma can be seen with steatosis. Electronically Signed   By: Tollie Ethavid  Kwon M.D.   On: 04/27/2018 14:13    Results/Tests Pending at Time of Discharge: None  Discharge Medications:  Allergies as of 04/30/2018      Reactions   Strawberry (diagnostic) Hives   Sulfa Antibiotics Nausea Only   Upset stomach      Medication List    STOP taking these medications   diphenhydramine-acetaminophen 25-500 MG Tabs tablet Commonly known as:  TYLENOL PM     TAKE these medications   acetaminophen 325 MG tablet Commonly known as:  TYLENOL Take 2 tablets (650 mg total) by mouth every 6 (six) hours as needed for up to 30 days for mild pain or moderate pain.       Discharge Instructions: Please refer to Patient Instructions section of EMR for full details.  Patient was counseled important signs and symptoms that should prompt return to medical care, changes in medications, dietary instructions, activity restrictions, and follow up appointments.   Follow-Up Appointments: Follow-up Information    [facility]. Call.   Why:  Call for Primary Care provider.  You may need to check with Medicaid to find who your assigned provider is. Contact information: [address] [place] [phone]       [facility] Follow up.   Specialty:  Internal Medicine Why:  Call for Primary Care provider.  You may need to check with Medicaid to find who your assigned provider is. Contact information: [address] [place] [phone]       [facility].   Specialty:  Family Medicine Why:  Call for Primary Care provider.  You may need to check with Medicaid to find who your assigned provider is. Contact information: [address], [address] [place] [phone]        [facility] Follow up.   Why:  Call for Primary Care provider.  You may need to check with Medicaid to find who your assigned provider is. Contact information: [place] [place] [phone]          Follow-up Information    [facility]. Call.   Why:  Call for Primary Care provider.  You may need to check with Medicaid to find who your assigned provider is. Contact information: [address] [place] [phone]       [facility] Follow up.   Specialty:  Internal Medicine Why:  Call for Primary Care provider.  You may need to check with Medicaid to find who your assigned provider is. Contact information: [address] [place] [phone]       [facility] Follow [facility].   Specialty:  Family Medicine Why:  Call for Primary Care provider.  You may need to check with Medicaid to find who your assigned provider is. Contact information: [address] [place] [phone]       [facility] Follow up.   Why:  Call for Primary Care provider.  You may need to check with Medicaid to find who your assigned provider is. Contact information: [address] [place] [address] [phone]         Dollene Cleveland, DO 05/02/2018, 9:45 PM PGY-1, [facility]

## 2018-04-29 NOTE — Progress Notes (Signed)
Family Medicine Teaching Service Daily Progress Note Intern Pager: [phone]  Patient name: Deanna Green Medical record number: [id number] Date of birth: 10-21-1980 Age: 38 y.o. Gender: female  Primary Care Provider: Patient, No Pcp Per Consultants: none Code Status: full  Pt Overview and Major Events to Date:  Admission Date 04/27/2018  Hospital Day: 2 days   Assessment and Plan: Deanna Green is a 38 y.o. female presenting with 5 days of epigastric pain and found to have and elevated lipase consistent with acute pancreatitis . PMH is significant for alcohol use disorder, migraines,  possible bipolar disorder.    Acute Alcoholic Pancreatitis, improving Lipase down trending 1488 > 860.  Still NPO. Pain improved. Past 24 hrs, has used 200 mcg of IV  Fentanyl, 0.5 of IV dilaudid = ~ 40 ME (PO). Triglycerides elevated at 448, but not in range to typically cause of pancreatitis.  - 17ml/hr NS mIVF - (itchy with oxy) dilaudid 3mg  q4 - zofran prn  - vitals per routine  Hypertriglyceridemia- tsh wnl Likely due to alcohol. - A1C pending (hospital having to send out do to lab failure, likely result evening 2/16).   Alcohol use disorder - O/n, CIWA 2,0,9,2 . EtOH neg on admit. SW to provide resources for substance use disorder assistance.  - CIWA protocol, (only 1 dose ativan to date) - thiamine daily - folic acid daily  Normocytic Anemia: anemia panel unremarkable but with low sat ratio (8) Hg 10.3.  - no sign of acute bleed, no indication for transfusion  Chronic, but stable medical conditions  Chronic daily headaches vs. Migraine,  Not endorsed this AM.  - pain management as above - outpatient neurology follow up  Possible Bipolar disorder - monitor patient's mood.   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  Discussed need for PCP follow-up with patient, she has been given list by care management but is having trouble finding one that takes Medicaid.  Subjective:  Had 3 cups of broth yesterday and was more optimistic about being able to increase intake today.  Rated pain most of the day yesterday at a 7-8, was at a 5 in morning rounds.  She was optimistic for discharge by tomorrow with a small chance of late today if she drastically improved  Objective: Temp:  [98.6 F (37 C)-98.8 F (37.1 C)] 98.6 F (37 C) (02/15 2117) Pulse Rate:  [72-87] 87 (02/15 2117) Resp:  [16-20] 20 (02/15 2117) BP: (109-129)/(60-87) 129/87 (02/15 2117) SpO2:  [100 %] 100 % (02/15 2117) Physical Exam: General: Mildly uncomfortable but able to sit up on her own power, nontoxic-appearing, pleasant Cardiovascular: Regular rate and rhythm, no murmurs Respiratory: No increased work of breathing, clear to auscultation bilaterally good air movement throughout Abdomen: Still with epigastric tenderness although subjectively improving, no distention Extremities: Able to sit up in bed without assistance Skin: No wounds or lesions to exposed skin  Laboratory: Recent Labs  Lab 04/27/18 1216 04/28/18 0349 04/29/18 0238  WBC 10.4 7.9 6.5  HGB 10.7* 10.3* 10.1*  HCT 33.2* 32.1* 32.6*  PLT 291 172 247   Recent Labs  Lab 04/27/18 1216 04/28/18 0349 04/29/18 0238  NA 135 137 138  K 3.8 3.5 4.1  CL 109 109 112*  CO2 19* 20* 20*  BUN 5* <5* <5*  CREATININE 0.70 0.77 0.53  CALCIUM 8.6* 8.0* 7.9*  PROT 5.9*  --   --   BILITOT 0.5  --   --   ALKPHOS 39  --   --   ALT 14  --   --   AST 19  --   --   GLUCOSE 101* 104* 104*      Imaging/Diagnostic Tests: US Abdomen Complete  Result Date: 04/27/2018 CLINICAL DATA:  Abdominal pain for 1 week. EXAM: ABDOMEN ULTRASOUND COMPLETE COMPARISON:  Abdomen radiographs from 04/26/2018 FINDINGS: Gallbladder:  No gallstones or wall thickening visualized. No sonographic Murphy sign noted by sonographer. Common bile duct: Diameter: 6.2 mm Liver: No focal lesion identified. Mild increase in hepatic echogenicity may represent hepatic steatosis. Portal vein is patent on color Doppler imaging with normal direction of blood flow towards the liver. IVC: No abnormality visualized. Pancreas: Mild ectasia of the pancreatic duct to 4.9 mm without definite stone nor apparent pancreatic mass. The head of the pancreas is obscured by bowel gas. Spleen: Size and appearance within normal limits. Right Kidney: Length: 12.1 cm. Echogenicity within normal limits. No mass or hydronephrosis visualized. Left Kidney: Length: 11.1 cm. Echogenicity within normal limits. No mass or hydronephrosis visualized. Abdominal aorta: No aneurysm visualized. Other findings: None. IMPRESSION: 1. No sonographic evidence for the patient's pain. 2. Mild ectasia of the pancreatic duct. 3. Echogenic liver parenchyma can be seen with steatosis. Electronically Signed   By: Tollie Eth M.D.   On: 04/27/2018 14:13    Marthenia Rolling, DO 04/29/2018, 5:06 AM PGY-2, [facility] FPTS Intern pager: [phone], text pages welcome

## 2018-04-29 NOTE — Care Management (Signed)
[facility] listed on AVS for patient to call.  Pt should check with social services to find who her assigned provider is and have changed if necessary.  D/W patient.

## 2018-04-30 LAB — CBC
HCT: 32.7 % — ABNORMAL LOW (ref 36.0–46.0)
Hemoglobin: 10.1 g/dL — ABNORMAL LOW (ref 12.0–15.0)
MCH: 29.4 pg (ref 26.0–34.0)
MCHC: 30.9 g/dL (ref 30.0–36.0)
MCV: 95.1 fL (ref 80.0–100.0)
Platelets: 311 10*3/uL (ref 150–400)
RBC: 3.44 MIL/uL — ABNORMAL LOW (ref 3.87–5.11)
RDW: 13.5 % (ref 11.5–15.5)
WBC: 6.6 10*3/uL (ref 4.0–10.5)
nRBC: 0 % (ref 0.0–0.2)

## 2018-04-30 LAB — BASIC METABOLIC PANEL
Anion gap: 9 (ref 5–15)
BUN: <5 mg/dL — ABNORMAL LOW (ref 6–20)
CO2: 19 mmol/L — ABNORMAL LOW (ref 22–32)
Calcium: 8 mg/dL — ABNORMAL LOW (ref 8.9–10.3)
Chloride: 110 mmol/L (ref 98–111)
Creatinine, Ser: 0.66 mg/dL (ref 0.44–1.00)
GFR calc Af Amer: >60 mL/min (ref >60)
Glucose, Bld: 106 mg/dL — ABNORMAL HIGH (ref 70–99)
Potassium: 3.7 mmol/L (ref 3.5–5.1)
Sodium: 138 mmol/L (ref 135–145)

## 2018-04-30 LAB — HEMOGLOBIN A1C
Hgb A1c MFr Bld: 4.9 % (ref 4.8–5.6)
MEAN PLASMA GLUCOSE: 94 mg/dL

## 2018-04-30 LAB — PREGNANCY, URINE: Preg Test, Ur: NEGATIVE

## 2018-04-30 MED ORDER — ACETAMINOPHEN 325 MG PO TABS: 650.0000 mg | ORAL_TABLET | Freq: Four times a day (QID) | ORAL | 0 refills | Status: AC | PRN

## 2018-04-30 NOTE — Discharge Instructions (Signed)
Acute Pancreatitis ° °Acute pancreatitis happens when the pancreas gets swollen. The pancreas is a large gland behind the stomach. The pancreas helps control blood sugar. It also makes enzymes that help digest food. This condition happens when the enzymes attack the pancreas and damage it. Most attacks last a couple of days and are dangerous. The lungs, heart, and kidneys may stop working. °What are the causes? °· Alcohol abuse. °· Drug abuse. °· Gallstones. °· Some medicines. °· Some chemicals. °· Infection. °· Damage caused by an accident.. °· Belly (abdominal) surgery. °· In some cases, the cause is not known. °What are the signs or symptoms? °· Pain in the upper belly and back. °· Swelling of the belly °· Feeling sick to your stomach (nausea) and throwing up (vomiting). °How is this treated? °· You will probably have to stay in the hospital. °? Treatment may include: °§ Fluid through an IV. °§ A tube to remove stomach contents and stop you from throwing up. °§ Not eating for 3-4 days. °§ Pain medicine. °§ Antibiotic medicines if you have an infection. °§ Surgery on the pancreas or gallbladder. °Follow these instructions at home: °Eating and drinking ° °· Follow instructions from your doctor about diet. °· Eat small meals often. Avoid eating big meals. °· Eat foods that do not have a lot of fat in them. °· Drink enough fluid to keep your pee (urine) pale yellow. °· Do not drink alcohol if it caused your condition. °General instructions °· Take over-the-counter and prescription medicines only as told by your doctor. °· Do not use cigarettes, e-cigarettes, and chewing tobacco. If you need help quitting, ask your doctor. °· Get plenty of rest. °· If directed, check your blood sugar at home as told by your doctor. °· Keep all follow-up visits as told by your doctor. This is important. °Contact a doctor if: °· You do not get better as quickly as expected. °· You have new symptoms. °· Your symptoms get worse. °· You  have lasting pain or weakness. °· You continue to feel sick to your stomach. °· You get better and then you have another pain attack. °· You have a fever. °Get help right away if: °· You cannot eat or keep fluids down. °· Your pain becomes very bad. °· Your skin or the white part of your eyes turns yellow. °· You throw up. °· You feel dizzy or you pass out. °· Your blood sugar is high (over 300 mg/dL). °Summary °· Acute pancreatitis happens when the pancreas gets swollen. °· This condition is usually caused by alcohol abuse, drug abuse, or gallstones. °· You will probably have to stay in the hospital for treatment. °This information is not intended to replace advice given to you by your health care provider. Make sure you discuss any questions you have with your health care provider. °Document Released: 08/17/2007 Document Revised: 07/04/2016 Document Reviewed: 12/02/2014 °Elsevier Interactive Patient Education © 2019 [employer] ° °

## 2018-04-30 NOTE — Progress Notes (Signed)
Pt for discharge going home, health teachings given, next appointment, due med explained and understood, discontinued peripheral IV line, given all her personal belongings, also given dilaudid prior to discharge, no complain of pain at this time, she tolerates her diet, independent, ambulates in the hallway.

## 2018-04-30 NOTE — Progress Notes (Signed)
Pt discharged going home, ambulatory, no complain of pain.

## 2018-04-30 NOTE — Clinical Social Work Note (Signed)
Clinical Social Work Assessment  Patient Details  Name: Deanna Green MRN: [id number] Date of Birth: 1980-08-17  Date of referral:  04/30/18               Reason for consult:  Substance Use/ETOH Abuse, Mental Health Concerns                Permission sought to share information with:  Family Supports Permission granted to share information::  No   Housing/Transportation Living arrangements for the past 2 months:  [place] of Information:  Patient Patient Interpreter Needed:  None Criminal Activity/Legal Involvement Pertinent to Current Situation/Hospitalization:  No - Comment as needed Significant Relationships:  Spouse, Parents Lives with:  Parents, Spouse Do you feel safe going back to the place where you live?  Yes Need for family participation in patient care:  No (Coment)  Care giving concerns: Pt with hx of mental health diagnosis as well as polysubstance use. Pt drinks daily, consulted for resource referral.    Social Worker assessment / plan:  CSW met with pt at bedside. Introduced self, role, and reason for visit. Pt aware of reason for visit- "I think youre here to talk about my drinking." CSW and pt discussed frequency of use- pt states she drinks about 2-3 malt beverages a day. Pt states that this is much improved from when she was drinking more than that daily, she also states that her use increases on the weekend when she drinks with her husband and her mother.   CSW offered to provide resources regarding inpatient and outpatient substance use supports including counselors that support dual diagnoses and could support pt with mental health diagnoses. Pt states that she does not like to see therapies, acknowledges her mental health dx and states "I don't want to see anyone for those either."   Pt reports she has stopped smoking marijuana but does continue to smoke cigarettes. Pt states because she has managed to make these changes cutting back she does not think  that she has an issue with substance use. Pt declines any resources stating she will continue to monitor her drinking and does not state any desire to quit smoking.   Let pt know that if she changed her mind that CSW remains available as needed.   Employment status: employed  Forensic scientist:    Medicaid In [place] PT Recommendations:  Not assessed at this time Information / Referral to community resources:  Outpatient Substance Abuse Treatment Options, Support Groups, Residential Substance Abuse Treatment Options(pt declined)  Patient/Family's Response to care:  Pt amenable to speaking with CSW, declines any additional supports/resources.   Patient/Family's Understanding of and Emotional Response to Diagnosis, Current Treatment, and Prognosis:  Pt states understanding of her diagnosis, current treatment and prognosis. Pt states that she thinks "it's either going to be my liver or my lungs that go first." Pt declines any desire to quit smoking cigarettes or quit drinking ETOH. Pt was pleasant and forthcoming with what she feels are her challenges. Pt appears happy with care here at hospital. She is eager to d/c home.   Emotional Assessment Appearance:  Appears stated age Attitude/Demeanor/Rapport:  Self-Confident, Engaged Affect (typically observed):  Accepting, Adaptable Orientation:  Oriented to Self, Oriented to Place, Oriented to Situation, Oriented to  Time Alcohol / Substance use:  Alcohol Use, Tobacco Use Psych involvement (Current and /or in the community):  No (Comment)  Discharge Needs  Concerns to be addressed:  Substance Abuse Concerns, Mental Health  Concerns Readmission within the last 30 days:  No Current discharge risk:  Substance Abuse Barriers to Discharge:  Continued Medical Work up   [employer], [place] 04/30/2018, 1:26 PM

## 2020-05-01 ENCOUNTER — Other Ambulatory Visit: Payer: Self-pay | Admitting: Internal Medicine

## 2020-05-02 LAB — LIPID PANEL
Cholesterol: 220 mg/dL — ABNORMAL HIGH (ref <200)
HDL: 42 mg/dL — ABNORMAL LOW (ref >=50)
Non-HDL Cholesterol (Calc): 178 mg/dL (calc) — ABNORMAL HIGH (ref <130)
Total CHOL/HDL Ratio: 5.2 (calc) — ABNORMAL HIGH (ref <5.0)
Triglycerides: 604 mg/dL — ABNORMAL HIGH (ref <150)

## 2020-05-02 LAB — COMPLETE METABOLIC PANEL WITH GFR
AG Ratio: 1.6 (calc) (ref 1.0–2.5)
ALT: 18 U/L (ref 6–29)
AST: 26 U/L (ref 10–30)
Albumin: 5.1 g/dL (ref 3.6–5.1)
Alkaline phosphatase (APISO): 53 U/L (ref 31–125)
BUN: 8 mg/dL (ref 7–25)
CO2: 16 mmol/L — ABNORMAL LOW (ref 20–32)
Calcium: 10.4 mg/dL — ABNORMAL HIGH (ref 8.6–10.2)
Chloride: 105 mmol/L (ref 98–110)
Creat: 0.73 mg/dL (ref 0.50–1.10)
GFR, Est African American: 121 mL/min/{1.73_m2} (ref >=60)
GFR, Est Non African American: 104 mL/min/{1.73_m2} (ref >=60)
Globulin: 3.1 g/dL (calc) (ref 1.9–3.7)
Glucose, Bld: 84 mg/dL (ref 65–99)
Potassium: 4.6 mmol/L (ref 3.5–5.3)
Sodium: 138 mmol/L (ref 135–146)
Total Bilirubin: 0.6 mg/dL (ref 0.2–1.2)
Total Protein: 8.2 g/dL — ABNORMAL HIGH (ref 6.1–8.1)

## 2020-05-02 LAB — CBC
HCT: 39.1 % (ref 35.0–45.0)
Hemoglobin: 13.4 g/dL (ref 11.7–15.5)
MCH: 32.1 pg (ref 27.0–33.0)
MCHC: 34.3 g/dL (ref 32.0–36.0)
MCV: 93.8 fL (ref 80.0–100.0)
MPV: 12.4 fL (ref 7.5–12.5)
Platelets: 272 10*3/uL (ref 140–400)
RBC: 4.17 10*6/uL (ref 3.80–5.10)
RDW: 13 % (ref 11.0–15.0)
WBC: 10 10*3/uL (ref 3.8–10.8)

## 2020-05-02 LAB — TSH: TSH: 0.88 mIU/L

## 2020-05-02 LAB — VITAMIN D 25 HYDROXY (VIT D DEFICIENCY, FRACTURES): Vit D, 25-Hydroxy: 12 ng/mL — ABNORMAL LOW (ref 30–100)

## 2020-05-04 ENCOUNTER — Emergency Department (HOSPITAL_COMMUNITY)
Admission: EM | Admit: 2020-05-04 | Discharge: 2020-05-04 | Disposition: A | Discharge status: Home / Self Care | Payer: Medicaid Other | Attending: Emergency Medicine | Admitting: Emergency Medicine

## 2020-05-04 ENCOUNTER — Encounter (HOSPITAL_COMMUNITY): Payer: Self-pay

## 2020-05-04 DIAGNOSIS — R1013 Epigastric pain: Secondary | ICD-10-CM | POA: Diagnosis present

## 2020-05-04 DIAGNOSIS — K852 Alcohol induced acute pancreatitis without necrosis or infection: Secondary | ICD-10-CM | POA: Insufficient documentation

## 2020-05-04 DIAGNOSIS — F17200 Nicotine dependence, unspecified, uncomplicated: Secondary | ICD-10-CM | POA: Insufficient documentation

## 2020-05-04 DIAGNOSIS — J45909 Unspecified asthma, uncomplicated: Secondary | ICD-10-CM | POA: Diagnosis not present

## 2020-05-04 LAB — COMPREHENSIVE METABOLIC PANEL
ALT: 20 U/L (ref 0–44)
AST: 22 U/L (ref 15–41)
Albumin: 4.6 g/dL (ref 3.5–5.0)
Alkaline Phosphatase: 49 U/L (ref 38–126)
Anion gap: 12 (ref 5–15)
BUN: 6 mg/dL (ref 6–20)
CO2: 21 mmol/L — ABNORMAL LOW (ref 22–32)
Calcium: 8.8 mg/dL — ABNORMAL LOW (ref 8.9–10.3)
Chloride: 102 mmol/L (ref 98–111)
Creatinine, Ser: 0.62 mg/dL (ref 0.44–1.00)
GFR, Estimated: >60 mL/min (ref >60)
Glucose, Bld: 111 mg/dL — ABNORMAL HIGH (ref 70–99)
Potassium: 3.6 mmol/L (ref 3.5–5.1)
Sodium: 135 mmol/L (ref 135–145)
Total Bilirubin: 1 mg/dL (ref 0.3–1.2)
Total Protein: 8 g/dL (ref 6.5–8.1)

## 2020-05-04 LAB — CBC
HCT: 37.6 % (ref 36.0–46.0)
Hemoglobin: 12.3 g/dL (ref 12.0–15.0)
MCH: 31.3 pg (ref 26.0–34.0)
MCHC: 32.7 g/dL (ref 30.0–36.0)
MCV: 95.7 fL (ref 80.0–100.0)
Platelets: 270 10*3/uL (ref 150–400)
RBC: 3.93 MIL/uL (ref 3.87–5.11)
RDW: 12.8 % (ref 11.5–15.5)
WBC: 5.6 10*3/uL (ref 4.0–10.5)
nRBC: 0 % (ref 0.0–0.2)

## 2020-05-04 LAB — LIPASE, BLOOD: Lipase: 26 U/L (ref 11–51)

## 2020-05-04 LAB — I-STAT BETA HCG BLOOD, ED (MC, WL, AP ONLY): I-stat hCG, quantitative: <5 m[IU]/mL (ref <5)

## 2020-05-04 MED ORDER — OXYCODONE-ACETAMINOPHEN 5-325 MG PO TABS: 1.0000 | ORAL_TABLET | ORAL | 0 refills | Status: DC | PRN

## 2020-05-04 MED ORDER — ONDANSETRON HCL 4 MG/2ML IJ SOLN
4.0000 mg | Freq: Once | INTRAMUSCULAR | Status: AC
  Administered 2020-05-04: 4 mg via INTRAVENOUS
  Filled 2020-05-04: qty 2

## 2020-05-04 MED ORDER — HYDROMORPHONE HCL 1 MG/ML IJ SOLN
1.0000 mg | Freq: Once | INTRAMUSCULAR | Status: AC
  Administered 2020-05-04: 1 mg via INTRAVENOUS
  Filled 2020-05-04: qty 1

## 2020-05-04 MED ORDER — TRAMADOL HCL 50 MG PO TABS: 50.0000 mg | ORAL_TABLET | Freq: Four times a day (QID) | ORAL | 0 refills | Status: DC | PRN

## 2020-05-04 MED ORDER — SODIUM CHLORIDE 0.9 % IV BOLUS
1000.0000 mL | Freq: Once | INTRAVENOUS | Status: AC
  Administered 2020-05-04: 1000 mL via INTRAVENOUS

## 2020-05-04 NOTE — Discharge Instructions (Addendum)
Call your primary care doctor or specialist as discussed in the next 2-3 days.   Advise a clear liquid diet for 2-3 days.  Return immediately back to the ER if:  Your symptoms worsen within the next 12-24 hours. You develop new symptoms such as new fevers, persistent vomiting, new pain, shortness of breath, or new weakness or numbness, or if you have any other concerns.

## 2020-05-04 NOTE — ED Triage Notes (Signed)
Pt arrived via walk in, c/o diffuse abd pain, on and off x2 years, states worsening the last 24 hrs. States hx of pancreatitis. States this feels similar, nauseated and diarrhea.

## 2020-05-04 NOTE — ED Provider Notes (Signed)
[facility]-EMERGENCY DEPT Provider Note   CSN: [id number] Arrival date & time: 05/04/20  0915     History Chief Complaint  Patient presents with  . Abdominal Pain    Deanna Green is a 40 y.o. female.  Patient presents to ER for recurrent epigastric abdominal pain.  She states she has had it off and on for the past 2 years.  She had a recent exacerbation several weeks ago and was admitted for acute pancreatitis.  She states that this time the pain is only been going on for about 2 days.  Describes a sharp and aching nonradiating.  Associated positive vomiting nonbloody.  No diarrhea no fevers no cough.  Patient admits to continued alcohol use.          Past Medical History:  Diagnosis Date  . Asthma   . Bipolar disorder (HCC)   . Migraine     Patient Active Problem List   Diagnosis Date Noted  . Tobacco use disorder 04/28/2018  . Alcohol abuse   . Hypertriglyceridemia   . Mood disorder in conditions classified elsewhere   . Chronic nonintractable headache   . Pancreatitis 04/27/2018    History reviewed. No pertinent surgical history.   OB History   No obstetric history on file.     History reviewed. No pertinent family history.  Social History   Tobacco Use  . Smoking status: Current Every Day Smoker  . Smokeless tobacco: Never Used  Substance Use Topics  . Alcohol use: Yes  . Drug use: No    Home Medications Prior to Admission medications   Medication Sig Start Date End Date Taking? Authorizing Provider  oxyCODONE-acetaminophen (PERCOCET/ROXICET) 5-325 MG tablet Take 1 tablet by mouth every 4 (four) hours as needed for severe pain. 05/04/20  Yes Amaiyah Nordhoff, Eustace Moore, MD    Allergies    Strawberry (diagnostic) and Sulfa antibiotics  Review of Systems   Review of Systems  Constitutional: Negative for fever.  HENT: Negative for ear pain.   Eyes: Negative for pain.  Respiratory: Negative for cough.   Cardiovascular: Negative for  chest pain.  Gastrointestinal: Positive for abdominal pain.  Genitourinary: Negative for flank pain.  Musculoskeletal: Negative for back pain.  Skin: Negative for rash.  Neurological: Negative for headaches.    Physical Exam Updated Vital Signs BP 129/90   Pulse 77   Temp 98.2 F (36.8 C) (Oral)   Resp 16   SpO2 99%   Physical Exam Constitutional:      General: She is not in acute distress.    Appearance: Normal appearance.  HENT:     Head: Normocephalic.     Nose: Nose normal.  Eyes:     Extraocular Movements: Extraocular movements intact.  Cardiovascular:     Rate and Rhythm: Normal rate.  Pulmonary:     Effort: Pulmonary effort is normal.  Abdominal:     Tenderness: There is abdominal tenderness in the epigastric area.  Musculoskeletal:        General: Normal range of motion.     Cervical back: Normal range of motion.  Neurological:     General: No focal deficit present.     Mental Status: She is alert. Mental status is at baseline.     ED Results / Procedures / Treatments   Labs (all labs ordered are listed, but only abnormal results are displayed) Labs Reviewed  COMPREHENSIVE METABOLIC PANEL - Abnormal; Notable for the following components:      Result  Value   CO2 21 (*)    Glucose, Bld 111 (*)    Calcium 8.8 (*)    All other components within normal limits  LIPASE, BLOOD  CBC  URINALYSIS, ROUTINE W REFLEX MICROSCOPIC  I-STAT BETA HCG BLOOD, ED ([facility], [facility], [facility] ONLY)    EKG None  Radiology No results found.  Procedures Procedures   Medications Ordered in ED Medications  HYDROmorphone (DILAUDID) injection 1 mg (1 mg Intravenous Given 05/04/20 1217)  sodium chloride 0.9 % bolus 1,000 mL (1,000 mLs Intravenous New Bag/Given 05/04/20 1214)  ondansetron (ZOFRAN) injection 4 mg (4 mg Intravenous Given 05/04/20 1215)    ED Course  I have reviewed the triage vital signs and the nursing notes.  Pertinent labs & imaging results that were available during  my care of the patient were reviewed by me and considered in my medical decision making (see chart for details).    MDM Rules/Calculators/A&P                          Labs show normal lipase normal white count normal chemistry.  Prior ultrasound shows no evidence of gallstones.  Liver enzymes appear normal today.  Patient given Dilaudid IV fluids and Zofran with subsequent improvement and near resolution of symptoms.  Suspect early pancreatitis recurrent.  Advised liquid diet at home pain medications and follow-up with GI physician.  Advised me to return for worsening pain fevers or any additional concerns.  Final Clinical Impression(s) / ED Diagnoses Final diagnoses:  Alcohol-induced acute pancreatitis, unspecified complication status    Rx / DC Orders ED Discharge Orders         Ordered    oxyCODONE-acetaminophen (PERCOCET/ROXICET) 5-325 MG tablet  Every 4 hours PRN        05/04/20 1332           Cheryll Cockayne, MD 05/04/20 1333

## 2020-09-28 ENCOUNTER — Other Ambulatory Visit: Payer: Self-pay

## 2020-09-28 ENCOUNTER — Emergency Department (HOSPITAL_COMMUNITY)
Admission: EM | Admit: 2020-09-28 | Discharge: 2020-09-29 | Discharge status: Against Medical Advice | Payer: Medicaid Other

## 2020-09-29 NOTE — ED Notes (Signed)
Pt called for triage no response. Told by sort NT that pt left.

## 2021-05-31 ENCOUNTER — Emergency Department (HOSPITAL_COMMUNITY)
Admission: EM | Admit: 2021-05-31 | Discharge: 2021-06-01 | Disposition: A | Discharge status: Home / Self Care | Payer: Medicaid Other | Attending: Emergency Medicine | Admitting: Emergency Medicine

## 2021-05-31 ENCOUNTER — Encounter (HOSPITAL_COMMUNITY): Payer: Self-pay

## 2021-05-31 ENCOUNTER — Emergency Department (HOSPITAL_COMMUNITY): Payer: Medicaid Other

## 2021-05-31 DIAGNOSIS — M25512 Pain in left shoulder: Secondary | ICD-10-CM | POA: Insufficient documentation

## 2021-05-31 DIAGNOSIS — R202 Paresthesia of skin: Secondary | ICD-10-CM | POA: Insufficient documentation

## 2021-05-31 DIAGNOSIS — J45909 Unspecified asthma, uncomplicated: Secondary | ICD-10-CM | POA: Diagnosis not present

## 2021-05-31 MED ORDER — OXYCODONE-ACETAMINOPHEN 5-325 MG PO TABS
1.0000 | ORAL_TABLET | Freq: Once | ORAL | Status: AC
  Administered 2021-05-31: 1 via ORAL
  Filled 2021-05-31: qty 1

## 2021-05-31 NOTE — ED Triage Notes (Signed)
Pt reports shoulder pain after a work injury x1 year ago. Pt reports that for the past x2 weeks she has been having worsening shoulder pain. Pt reports that she is taking OTC meds with no relief. She also reports she has started trying heat and cold therapy with no relief.  ?

## 2021-05-31 NOTE — ED Provider Triage Note (Signed)
Emergency Medicine Provider Triage Evaluation Note ? ?Deanna Green , a 41 y.o. female  was evaluated in triage.  Pt complains of right shoulder pain onset 2 weeks.  Patient notes that her shoulder pain started 1 year ago after work injury.  She was evaluated through [facility] and had negative scans completed.  She started physical therapy in May 2022.  She was last evaluated by her orthopedist in October 2022.  She notes that through her job she has been lifting more than normal over the past 2 weeks.  Denies recent fall, injury, trauma.  She has tried Advil and ice and heat without relief of her symptoms.  Denies color change, wound. ? ?Review of Systems  ?Positive: As per HPI above ?Negative:  ? ?Physical Exam  ?BP (!) 145/97 (BP Location: Right Arm)   Pulse 91   Temp 98.9 ?F (37.2 ?C) (Oral)   Resp (!) 22   SpO2 100%  ?Gen:   Awake, no distress   ?Resp:  Normal effort  ?MSK:   Moves extremities without difficulty  ?Other:  Tenderness to palpation noted to anterior left shoulder.  Decreased range of motion secondary to pain.  No C, T, L, S spinal tenderness to palpation. ? ?Medical Decision Making  ?Medically screening exam initiated at 8:41 PM.  Appropriate orders placed.  Deanna Green was informed that the remainder of the evaluation will be completed by another provider, this initial triage assessment does not replace that evaluation, and the importance of remaining in the ED until their evaluation is complete. ? ?  ?Deanna Green A, PA-C ?05/31/21 2117 ? ?

## 2021-06-01 MED ORDER — LIDOCAINE 5 % EX PTCH: 1.0000 | MEDICATED_PATCH | Freq: Every day | CUTANEOUS | 0 refills | Status: AC | PRN

## 2021-06-01 MED ORDER — PREDNISONE 50 MG PO TABS: 50.0000 mg | ORAL_TABLET | Freq: Every day | ORAL | 0 refills | Status: AC

## 2021-06-01 MED ORDER — KETOROLAC TROMETHAMINE 15 MG/ML IJ SOLN
30.0000 mg | Freq: Once | INTRAMUSCULAR | Status: AC
  Administered 2021-06-01: 30 mg via INTRAMUSCULAR
  Filled 2021-06-01: qty 2

## 2021-06-01 MED ORDER — PREDNISONE 50 MG PO TABS: 50.0000 mg | ORAL_TABLET | Freq: Every day | ORAL | 0 refills | Status: DC

## 2021-06-01 MED ORDER — HYDROCODONE-ACETAMINOPHEN 5-325 MG PO TABS: 1.0000 | ORAL_TABLET | Freq: Four times a day (QID) | ORAL | 0 refills | Status: DC | PRN

## 2021-06-01 MED ORDER — LIDOCAINE 5 % EX PTCH
2.0000 | MEDICATED_PATCH | CUTANEOUS | Status: DC
  Administered 2021-06-01: 2 via TRANSDERMAL
  Filled 2021-06-01: qty 2

## 2021-06-01 MED ORDER — OXYCODONE-ACETAMINOPHEN 5-325 MG PO TABS
1.0000 | ORAL_TABLET | Freq: Once | ORAL | Status: AC
  Administered 2021-06-01: 1 via ORAL
  Filled 2021-06-01: qty 1

## 2021-06-01 NOTE — ED Notes (Signed)
Patient verbalizes understanding of d/c instructions. Opportunities for questions and answers were provided. Pt d/c from ED and ambulated to lobby.  

## 2021-06-01 NOTE — Discharge Instructions (Signed)
You were seen in the emergency department today for shoulder pain.  Your x-ray did not show any significant abnormalities.  We are sending home with the following medicines: ?- Prednisone: Take daily in the morning with breakfast to help with pain and inflammation ?- Lidoderm patch: Apply to area with significant pain once per day, remove and discard patch within 12 hours of application ?- Vicodin-this is a narcotic/controlled substance medication that has potential addicting qualities.  We recommend that you take 1-2 tablets every 6 hours as needed for severe pain.  Do not drive or operate heavy machinery when taking this medicine as it can be sedating. Do not drink alcohol or take other sedating medications when taking this medicine for safety reasons.  Keep this out of reach of small children.  Please be aware this medicine has Tylenol in it (325 mg/tab) do not exceed the maximum dose of Tylenol in a day per over the counter recommendations should you decide to supplement with Tylenol over the counter.  ? ?We have prescribed you new medication(s) today. Discuss the medications prescribed today with your pharmacist as they can have adverse effects and interactions with your other medicines including over the counter and prescribed medications. Seek medical evaluation if you start to experience new or abnormal symptoms after taking one of these medicines, seek care immediately if you start to experience difficulty breathing, feeling of your throat closing, facial swelling, or rash as these could be indications of a more serious allergic reaction ? ?We have placed you in a sling, please be sure to still move your shoulder while wearing the sling to avoid frozen shoulder syndrome. ? ?Please follow-up with orthopedic surgery as soon as possible, call today to schedule an appointment.  Return to the emergency department for any new or worsening symptoms including but not limited to new or worsening pain, fever, chest  pain, trouble breathing, or any other concerns. ?

## 2021-06-01 NOTE — ED Provider Notes (Signed)
?[facility] ?Provider Note ? ? ?CSN: [id number] ?Arrival date & time: 05/31/21  1821 ? ?  ? ?History ? ?Chief Complaint  ?Patient presents with  ? Shoulder Pain  ? ? ?Deanna Green is a 41 y.o. female with a history of bipolar disorder, asthma, migraines, and tobacco use who presents to the emergency department with complaints of acute on chronic left shoulder pain.  Patient reports that she injured her left shoulder approximately 1 year ago, she has been having problems with pain since then, this seemed to worsen over the past few days to week.  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  The pain is constant, severe, worse with any movement, no alleviating factors tried ibuprofen and Tylenol without relief.  States that sometimes she gets some paresthesias on the left upper extremity.  She denies weakness, chest pain, dyspnea, or fever. ? ?HPI ? ?  ? ?Home Medications ?Prior to Admission medications   ?Medication Sig Start Date End Date Taking? Authorizing Provider  ?acetaminophen (TYLENOL) 500 MG tablet Take 500 mg by mouth every 6 (six) hours as needed for mild pain.   Yes [provider]  ?HYDROcodone-acetaminophen (NORCO/VICODIN) 5-325 MG tablet Take 1-2 tablets by mouth every 6 (six) hours as needed. 06/01/21  Yes Chardonnay Holzmann R, PA-C  ?lidocaine (LIDODERM) 5 % Place 1 patch onto the skin daily as needed. Apply patch to area most significant pain once per day.  Remove and discard patch within 12 hours of application. 06/01/21  Yes Dearl Rudden R, PA-C  ?naproxen sodium (ALEVE) 220 MG tablet Take 220 mg by mouth 2 (two) times daily as needed (For pain).   Yes [provider]  ?predniSONE (DELTASONE) 50 MG tablet Take 1 tablet (50 mg total) by mouth daily with breakfast. 06/01/21   Patton Swisher, Pleas KochSamantha R, PA-C  ?   ? ?Allergies    ?Morphine, Strawberry (diagnostic), and Sulfa antibiotics    ? ?Review of Systems   ?Review of Systems  ?Constitutional:  Negative for chills and fever.  ?Respiratory:  Negative for shortness of breath.   ?Cardiovascular:  Negative for chest pain.  ?Gastrointestinal:  Negative for abdominal pain and vomiting.  ?Musculoskeletal:  Positive for arthralgias.  ?Skin:  Negative for color change and wound.  ?Neurological:  Negative for weakness and numbness.  ?     Positive for intermittent paresthesias.  ?All other systems reviewed and are negative. ? ?Physical Exam ?Updated Vital Signs ?BP (!) 151/97 (BP Location: Right Arm)   Pulse (!) 102   Temp 98.2 ?F (36.8 ?C) (Oral)   Resp 17   SpO2 100%  ?Physical Exam ?Vitals and nursing note reviewed.  ?Constitutional:   ?   Appearance: Normal appearance. She is not ill-appearing or toxic-appearing.  ?   Comments: Patient intermittently tearful throughout assessment.  ?HENT:  ?   Head: Normocephalic and atraumatic.  ?Neck:  ?   Comments: No midline tenderness.  ?Cardiovascular:  ?   Rate and Rhythm: Normal rate.  ?   Pulses:     ?     Radial pulses are 2+ on the right side and 2+ on the left side.  ?Pulmonary:  ?   Effort: No respiratory distress.  ?   Breath sounds: Normal breath sounds.  ?Musculoskeletal:  ?   Cervical back: Normal range of motion and neck supple. Muscular tenderness (Left paraspinal muscles especially over the  trapezius.) present. No spinous process tenderness.  ?   Comments: Upper extremities: No obvious deformity, appreciable swelling, edema, erythema, ecchymosis, warmth, or open wounds. Patient has intact AROM throughout with exception of significant limitation in active flexion/abduction as this causes patient significant pain. Tender to palpation left glenohumeral joint and proximal one third of the humerus.  Otherwise nontender.  Compartments are soft.Marland Kitchen   ?Skin: ?   General: Skin is warm and dry.  ?   Capillary Refill: Capillary refill takes less than 2 seconds.  ?Neurological:  ?   Mental Status: She is  alert.  ?   Comments: Alert. Clear speech. Sensation grossly intact to bilateral upper extremities. 5/5 symmetric grip strength. Ambulatory.   ?Psychiatric:     ?   Mood and Affect: Mood normal.     ?   Behavior: Behavior normal.  ? ? ?ED Results / Procedures / Treatments   ?Labs ?(all labs ordered are listed, but only abnormal results are displayed) ?Labs Reviewed - No data to display ? ?EKG ?None ? ?Radiology ?DG Shoulder Left ? ?Result Date: 05/31/2021 ?CLINICAL DATA:  Shoulder pain with movement EXAM: LEFT SHOULDER - 2+ VIEW COMPARISON:  None. FINDINGS: No fracture or dislocation is seen. The joint spaces are preserved. Visualized soft tissues are within normal limits. Visualized left lung is clear. IMPRESSION: Negative. Electronically Signed   By: Charline Bills M.D.   On: 05/31/2021 21:47   ? ?Procedures ?Procedures  ? ? ?Medications Ordered in ED ?Medications  ?lidocaine (LIDODERM) 5 % 2 patch (2 patches Transdermal Patch Applied 06/01/21 0613)  ?oxyCODONE-acetaminophen (PERCOCET/ROXICET) 5-325 MG per tablet 1 tablet (1 tablet Oral Given 05/31/21 2118)  ?ketorolac (TORADOL) 15 MG/ML injection 30 mg (30 mg Intramuscular Given 06/01/21 0610)  ?oxyCODONE-acetaminophen (PERCOCET/ROXICET) 5-325 MG per tablet 1 tablet (1 tablet Oral Given 06/01/21 0646)  ? ? ?ED Course/ Medical Decision Making/ A&P ?  ?                        ?Medical Decision Making ?Risk ?Prescription drug management. ? ?Patient presents to the emergency department with acute on chronic left shoulder pain.  Nontoxic, blood pressure elevated, doubt hypertensive emergency. ? ?Chart reviewed and nursing note reviewed for additional history. ? ?X-ray ordered in triage, I have viewed and interpreted imaging, agree with radiologist, negative. ? ?X-ray without fracture or dislocation.  Patient has no overlying erythema, she is afebrile, I have a low suspicion for septic joint.  There is no edema to the left upper extremity, feel that DVT is less likely.   Patient is neurovascularly intact distally.  Suspect she may have some type of rotator cuff/ligamentous injury.  She previously was seen by [facility], she states that she had negative x-rays at that time they sent her to physical therapy and prescribed her prior medications, however none of this seemed to help.  She would like to see a new orthopedic doctor.  She was given supportive care in the emergency department and we will discharge home with a short course of Norco given degree of patient's pain [facility] Controlled Substance reporting System queried), prednisone for possible radiculopathy, and lidoderm patches.  She was provided with a sling for comfort, she was educated on importance for continued mobility to avoid adhesive capsulitis.  Encouraged her to follow-up with orthopedics, today's call person was provided to the patient for follow-up.  I discussed results, treatment plan, need for follow-up, and return precautions with the patient.  Provided opportunity for questions, patient confirmed understanding and is in agreement with plan.  ? ?Findings and plan of care discussed with supervising physician Dr. Madilyn Hook who is in agreement.  ? ?Final Clinical Impression(s) / ED Diagnoses ?Final diagnoses:  ?Left shoulder pain, unspecified chronicity  ? ? ?Rx / DC Orders ?ED Discharge Orders   ? ?      Ordered  ?  predniSONE (DELTASONE) 50 MG tablet  Daily with breakfast,   Status:  Discontinued       ? 06/01/21 0653  ?  lidocaine (LIDODERM) 5 %  Daily PRN       ? 06/01/21 0653  ?  HYDROcodone-acetaminophen (NORCO/VICODIN) 5-325 MG tablet  Every 6 hours PRN       ? 06/01/21 0653  ?  predniSONE (DELTASONE) 50 MG tablet  Daily with breakfast       ? 06/01/21 0656  ? ?  ?  ? ?  ? ? ?  ?Cherly Anderson, [place] ?06/01/21 0768 ? ?  ?Tilden Fossa, MD ?06/07/21 1712 ? ?

## 2021-06-09 ENCOUNTER — Encounter: Payer: Self-pay | Admitting: Orthopaedic Surgery

## 2021-06-09 ENCOUNTER — Ambulatory Visit (INDEPENDENT_AMBULATORY_CARE_PROVIDER_SITE_OTHER): Payer: Medicaid Other | Admitting: Physician Assistant

## 2021-06-09 ENCOUNTER — Other Ambulatory Visit: Payer: Self-pay

## 2021-06-09 DIAGNOSIS — G8929 Other chronic pain: Secondary | ICD-10-CM

## 2021-06-09 DIAGNOSIS — M25512 Pain in left shoulder: Secondary | ICD-10-CM | POA: Diagnosis not present

## 2021-06-09 NOTE — Progress Notes (Signed)
? ?  Office Visit Note ?  ?Patient: Deanna Green           ?Date of Birth: 12/19/1980           ?MRN: [id number] ?Visit Date: 06/09/2021 ?             ?Requested by: Nolene Ebbs, MD ?[address] ?[place],  [place] [address] ?PCP: Nolene Ebbs, MD ? ? ?Assessment & Plan: ?Visit Diagnoses:  ?1. Chronic left shoulder pain   ? ? ?Plan: Impression is chronic left shoulder pain concerning for biceps and rotator cuff pathology.  At this point, would like to get an MRI to assess for structural abnormalities that she has had minimal to no relief with subacromial cortisone injection as well as a course of physical therapy.  She will follow-up with [place] once this is completed.  Call with concerns or questions. ? ?Follow-Up Instructions: Return for after MRI.  ? ?Orders:  ?No orders of the defined types were placed in this encounter. ? ?No orders of the defined types were placed in this encounter. ? ? ? ? Procedures: ?No procedures performed ? ? ?Clinical Data: ?No additional findings. ? ? ?Subjective: ?Chief Complaint  ?Patient presents with  ? Left Shoulder - Pain  ? ? ?HPI patient is a pleasant 41 year old female who comes in today with left shoulder pain for the past year.  She works as a Programme researcher, broadcasting/film/video and was carrying a tray when she jammed it into a door.  The pain she has is primarily to the proximal aspect of the deltoid and into the front of the shoulder.  She describes this as a constant dull pain worse with shoulder abduction, internal rotation and cross body adduction.  She has been taking Tylenol without significant relief.  She was initially seen at [facility] where subacromial cortisone injection was performed without significant relief.  She has been to physical therapy which did not help.  ? ?Review of Systems as detailed in HPI.  All others reviewed and are negative. ? ? ?Objective: ?Vital Signs: There were no vitals taken for this visit. ? ?Physical Exam well-developed well-nourished female no acute distress.  Alert  and oriented x3. ? ?Ortho Exam left shoulder exam reveals forward flexion to approximately 120 degrees.  Internal rotation to her back pocket.  External rotation to about 30 degrees.  Positive empty can test.  Positive speeds test.  Negative O'Brien's test.  Negative belly press.  She has full strength throughout.  She is neurovascular intact distally. ? ?Specialty Comments:  ?No specialty comments available. ? ?Imaging: ?No new imaging ? ? ?PMFS History: ?Patient Active Problem List  ? Diagnosis Date Noted  ? Tobacco use disorder 04/28/2018  ? Alcohol abuse   ? Hypertriglyceridemia   ? Mood disorder in conditions classified elsewhere   ? Chronic nonintractable headache   ? Pancreatitis 04/27/2018  ? ?Past Medical History:  ?Diagnosis Date  ? Asthma   ? Bipolar disorder ([place])   ? Migraine   ?  ?History reviewed. No pertinent family history.  ?History reviewed. No pertinent surgical history. ?Social History  ? ?Occupational History  ? Not on file  ?Tobacco Use  ? Smoking status: Every Day  ? Smokeless tobacco: Never  ?Substance and Sexual Activity  ? Alcohol use: Yes  ? Drug use: No  ? Sexual activity: Not on file  ? ? ? ? ? ? ?

## 2021-06-19 ENCOUNTER — Other Ambulatory Visit: Payer: Medicaid Other

## 2021-06-20 ENCOUNTER — Ambulatory Visit
Admission: RE | Admit: 2021-06-20 | Discharge: 2021-06-20 | Disposition: A | Discharge status: Home / Self Care | Payer: Medicaid Other | Source: Ambulatory Visit | Attending: Orthopaedic Surgery | Admitting: Orthopaedic Surgery

## 2021-06-20 DIAGNOSIS — G8929 Other chronic pain: Secondary | ICD-10-CM

## 2021-06-30 ENCOUNTER — Encounter: Payer: Self-pay | Admitting: Orthopaedic Surgery

## 2021-07-02 ENCOUNTER — Other Ambulatory Visit: Payer: Self-pay | Admitting: Physician Assistant

## 2021-07-02 MED ORDER — TRAMADOL HCL 50 MG PO TABS: 50.0000 mg | ORAL_TABLET | Freq: Two times a day (BID) | ORAL | 0 refills | Status: DC | PRN

## 2021-07-02 NOTE — Telephone Encounter (Signed)
Sent in tramadol

## 2021-07-05 ENCOUNTER — Other Ambulatory Visit: Payer: Self-pay | Admitting: Physician Assistant

## 2021-07-05 MED ORDER — HYDROCODONE-ACETAMINOPHEN 5-325 MG PO TABS: 1.0000 | ORAL_TABLET | Freq: Two times a day (BID) | ORAL | 0 refills | Status: AC | PRN

## 2021-07-05 NOTE — Telephone Encounter (Signed)
I would think the tramadol would kick in within 30 minutes or so.  I will send in one small rx for norco, but cannot continue narcotics. Is she coming to see [place] this week?

## 2021-07-07 ENCOUNTER — Ambulatory Visit (INDEPENDENT_AMBULATORY_CARE_PROVIDER_SITE_OTHER): Payer: Medicaid Other | Admitting: Orthopaedic Surgery

## 2021-07-07 ENCOUNTER — Encounter: Payer: Self-pay | Admitting: Orthopaedic Surgery

## 2021-07-07 ENCOUNTER — Other Ambulatory Visit: Payer: Self-pay

## 2021-07-07 DIAGNOSIS — G8929 Other chronic pain: Secondary | ICD-10-CM

## 2021-07-07 DIAGNOSIS — M25512 Pain in left shoulder: Secondary | ICD-10-CM

## 2021-07-07 MED ORDER — ACETAMINOPHEN-CODEINE #3 300-30 MG PO TABS: 1.0000 | ORAL_TABLET | Freq: Two times a day (BID) | ORAL | 0 refills | Status: AC | PRN

## 2021-07-07 MED ORDER — METHOCARBAMOL 500 MG PO TABS: 500.0000 mg | ORAL_TABLET | Freq: Two times a day (BID) | ORAL | 2 refills | Status: AC | PRN

## 2021-07-07 NOTE — Progress Notes (Signed)
? ?  Office Visit Note ?  ?Patient: Deanna Green           ?Date of Birth: 21-Aug-1980           ?MRN: [id number] ?Visit Date: 07/07/2021 ?             ?Requested by: Fleet Contras, MD ?[address] ?[place],  [place] [address] ?PCP: Fleet Contras, MD ? ? ?Assessment & Plan: ?Visit Diagnoses:  ?1. Chronic left shoulder pain   ? ? ?Plan: Impression is chronic left shoulder pain with MRI findings suggestive of adhesive capsulitis, biceps tendinosis and questionable labral tear.  At this point, I would like to refer the patient to Dr. Alvester Morin for glenohumeral cortisone injection.  If her pain improves she will let me know we will refer her to outpatient physical therapy.  Otherwise, she is not interested in therapy as this does cause more pain in the past. ? ?Follow-Up Instructions: Return if symptoms worsen or fail to improve.  ? ?Orders:  ?No orders of the defined types were placed in this encounter. ? ?Meds ordered this encounter  ?Medications  ? acetaminophen-codeine (TYLENOL #3) 300-30 MG tablet  ?  Sig: Take 1 tablet by mouth 2 (two) times daily as needed for moderate pain.  ?  Dispense:  30 tablet  ?  Refill:  0  ? methocarbamol (ROBAXIN) 500 MG tablet  ?  Sig: Take 1 tablet (500 mg total) by mouth 2 (two) times daily as needed.  ?  Dispense:  20 tablet  ?  Refill:  2  ? ? ? ? Procedures: ?No procedures performed ? ? ?Clinical Data: ?No additional findings. ? ? ?Subjective: ?Chief Complaint  ?Patient presents with  ? Left Shoulder - Pain  ? ? ?HPI patient with chronic left shoulder pain comes in today to review MRI results of the left shoulder.  MRI from 06/20/2021 shows probable adhesive capsulitis and possible biceps tendinosis without tear in addition to questionable labral tear.  Patient has previously undergone physical therapy as well as subacromial cortisone injection without relief. ? ? ? ? ?Objective: ?Vital Signs: There were no vitals taken for this visit. ? ? ? ?Ortho Exam unchanged left shoulder  exam ? ?Specialty Comments:  ?No specialty comments available. ? ?Imaging: ?No new imaging ? ? ?PMFS History: ?Patient Active Problem List  ? Diagnosis Date Noted  ? Tobacco use disorder 04/28/2018  ? Alcohol abuse   ? Hypertriglyceridemia   ? Mood disorder in conditions classified elsewhere   ? Chronic nonintractable headache   ? Pancreatitis 04/27/2018  ? ?Past Medical History:  ?Diagnosis Date  ? Asthma   ? Bipolar disorder ([facility])   ? Migraine   ?  ?History reviewed. No pertinent family history.  ?History reviewed. No pertinent surgical history. ?Social History  ? ?Occupational History  ? Not on file  ?Tobacco Use  ? Smoking status: Every Day  ? Smokeless tobacco: Never  ?Substance and Sexual Activity  ? Alcohol use: Yes  ? Drug use: No  ? Sexual activity: Not on file  ? ? ? ? ? ? ?

## 2021-07-13 ENCOUNTER — Other Ambulatory Visit: Payer: Self-pay | Admitting: Physician Assistant

## 2021-07-13 MED ORDER — TRAMADOL HCL 50 MG PO TABS: 50.0000 mg | ORAL_TABLET | Freq: Two times a day (BID) | ORAL | 0 refills | Status: DC | PRN

## 2021-07-13 NOTE — Telephone Encounter (Signed)
sent 

## 2021-08-12 ENCOUNTER — Ambulatory Visit (INDEPENDENT_AMBULATORY_CARE_PROVIDER_SITE_OTHER): Payer: Medicaid Other | Admitting: Physical Medicine and Rehabilitation

## 2021-08-12 ENCOUNTER — Ambulatory Visit: Payer: Self-pay

## 2021-08-12 DIAGNOSIS — G8929 Other chronic pain: Secondary | ICD-10-CM

## 2021-08-12 DIAGNOSIS — M25512 Pain in left shoulder: Secondary | ICD-10-CM | POA: Diagnosis not present

## 2021-08-12 NOTE — Progress Notes (Signed)
 .  Numeric Pain Rating Scale and Functional Assessment Average Pain 8   In the last MONTH (on 0-10 scale) has pain interfered with the following?  1. General activity like being  able to carry out your everyday physical activities such as walking, climbing stairs, carrying groceries, or moving a chair?  Rating(8)   +Driver, -BT, -Dye Allergies.  

## 2021-08-12 NOTE — Progress Notes (Signed)
   Deanna Green - 41 y.o. female MRN [id number]  Date of birth: 1980-12-26  Office Visit Note: Visit Date: 08/12/2021 PCP: Fleet Contras, MD Referred by: Fleet Contras, MD  Subjective: Chief Complaint  Patient presents with   Left Shoulder - Pain   HPI:  Deanna Green is a 41 y.o. female who comes in today at the request of Dr. Glee Arvin for planned Left anesthetic glenohumeral arthrogram with fluoroscopic guidance.  The patient has failed conservative care including home exercise, medications, time and activity modification.  This injection will be diagnostic and hopefully therapeutic.  Please see requesting physician notes for further details and justification.   ROS Otherwise per HPI.  Assessment & Plan: Visit Diagnoses:    ICD-10-CM   1. Chronic left shoulder pain  M25.512 Large Joint Inj: L glenohumeral   G89.29 XR C-ARM NO REPORT      Plan: No additional findings.   Meds & Orders: No orders of the defined types were placed in this encounter.   Orders Placed This Encounter  Procedures   Large Joint Inj: L glenohumeral   XR C-ARM NO REPORT    Follow-up: Return for visit to requesting provider as needed.   Procedures: Large Joint Inj: L glenohumeral on 08/12/2021 1:17 PM Indications: pain and diagnostic evaluation Details: 22 G 3.5 in needle, fluoroscopy-guided anteromedial approach  Arthrogram: No  Medications: 40 mg triamcinolone acetonide 40 MG/ML; 5 mL bupivacaine 0.25 % Outcome: tolerated well, no immediate complications  There was excellent flow of contrast producing a partial arthrogram of the glenohumeral joint. The patient did have relief of symptoms during the anesthetic phase of the injection. Procedure, treatment alternatives, risks and benefits explained, specific risks discussed. Consent was given by the patient. Immediately prior to procedure a time out was called to verify the correct patient, procedure, equipment, support staff and site/side marked as  required. Patient was prepped and draped in the usual sterile fashion.          Clinical History: No specialty comments available.     Objective:  VS:  HT:    WT:   BMI:     BP:   HR: bpm  TEMP: ( )  RESP:  Physical Exam   Imaging: No results found.

## 2021-08-14 ENCOUNTER — Ambulatory Visit (HOSPITAL_COMMUNITY)
Admission: EM | Admit: 2021-08-14 | Discharge: 2021-08-14 | Discharge status: Against Medical Advice | Payer: Medicaid Other | Attending: Urology | Admitting: Urology

## 2021-08-14 DIAGNOSIS — F4321 Adjustment disorder with depressed mood: Secondary | ICD-10-CM | POA: Insufficient documentation

## 2021-08-14 DIAGNOSIS — F1010 Alcohol abuse, uncomplicated: Secondary | ICD-10-CM | POA: Insufficient documentation

## 2021-08-14 NOTE — ED Notes (Signed)
Pt signed AMA paperwork. [employer] called to transport pt home. Pt escorted to front lobby by staff. Belongings returned from blue locker. Safety maintained.

## 2021-08-14 NOTE — Progress Notes (Signed)
   08/14/21 0412  Patient Reported Information  How Did You Hear About [place]? Legal System  What Is the Reason for Your Visit/Call Today? Pt reports, "so tired of everything, everything, everything, I don't know how to explain it." Pt reports, she never intents to kill herself but one day she might cut too deep. Pt reports, she doesn't want to be here (in this world.) Pt reports, her husband has not been home since Thursday and receivedinbox messages from other women about her husband. Pt reports, yesterday she cut herself with an eye brow shaver because she couldn't find her razor.  How Long Has This Been Causing You Problems? 1-6 months  What Do You Feel Would Help You the Most Today? Alcohol or Drug Use Treatment;Medication(s);Stress Management;Social Support  Have You Recently Had Any Thoughts About Hurting Yourself?  (Pt reports, she never intents to kill herself but one day she might cut too deep. Pt reports, she doesn't want to be here (in this world.))  Are You Planning to Commit Suicide/Harm Yourself At This time? No  Have you Recently Had Thoughts About Hurting Someone Karolee Ohs? No  Are You Planning To Harm Someone At This Time? No  Have You Used Any Alcohol or Drugs in the Past 24 Hours? Yes  Do You Currently Have a Therapist/Psychiatrist? No  [facility] Screening Triage Referral Assessment  Type of Contact Face-to-Face  Location of Assessment [facility] Assessment Services  Provider location [facility] Assessment Services  Collateral Involvement None.  Is CPS involved or ever been involved? In the Past (Pt reports, her CPS cases were dismissed because kids acting out in school.)  Is APS involved or ever been involved? Never  Patient Determined To Be At Risk for Harm To Self or Others Based on Review of Patient Reported Information or Presenting Complaint? Yes, for Self-Harm  Does Patient Present under Involuntary Commitment? No  [place] of Residence [place]  Patient Currently Receiving the Following  Services: Not Receiving Services  Determination of Need Urgent (48 hours)  Options For Referral Medication Management;Inpatient Hospitalization;BH Urgent Care;Facility-Based Crisis;Outpatient Therapy    Determination of need: Urgent.    Redmond Pulling, MS, [facility], [facility] Triage Specialist [phone]

## 2021-08-14 NOTE — ED Provider Notes (Signed)
Behavioral Health Urgent Care Medical Screening Exam  Patient Name: Deanna Green MRN: [id number] Date of Evaluation: 08/14/21 Chief Complaint:   Diagnosis:  Final diagnoses:  Alcohol abuse  Adjustment disorder with depressed mood    History of Present illness: Deanna Green is a 41 y.o. female with history of alcohol abuse.  Patient presented voluntarily to [facility] via Patent examiner. Patient presented with chief complaint of increased stress and alcohol abuse.  This provider reviewed patient's chart and met with her face to face to complete her assessment. An approach, patient is noted to be smelling of alcohol although she is alert and oriented x4; her speech is clear and coherent. She was tearful when discussing relationship problems with her husband. Patient's mood is depressed with congruent affect.   Patient describes herself as " a functioning alcoholic." She reports that she drinks approximately 1/5 to 1 bottle of liquor daily. She says she last consumed alcohol around 7pm Friday night 08/13/21. She denies history of alcohol withdrawal DT or seizures. She reports that her mother contacted law enforcement tonight due to her "spazzing out" she clarified "spazzing out" as irritable and crying. Patient reports that she has been increasingly stressed due to relationship problems with her husband. She reports that her husband has been cheating on her with other women and that he left home since Thursday 08/12/21 and has not returned. She says she is stressed due to "husband's infidelity, kids misbehaving, and her mother always complaining and nagging." She says she engaged in self harming by cutting on Thursday. She denies suicidal ideation/plan; she says she self-harmed to relieve stress. She admits to prior history of one suicidal attempt by overdosing while pregnant 16 years ago. She denied any other suicidal attempts. She denies homicidal ideation, hallucination, paranoia, and illegal substance abuse.    Patient also reports that she has psychiatric history of bipolar disorder. She says she was previously on Lithium 1200 mg/day. She says she discontinued taking medication years ago because she didn't like how medications made her feel. She says she is not interested in taking any medication for her mental health. She also says she does not want to talk to a therapist.   Discuss continuous assessment with patient. Patient refused recommendation for admission to [facility] for continuous assessment. She says she wants to go home and be with her children because she doesn't want them to wake up and both parents are not home. This Clinical research associate discussed the benefit of continuous assessment, medication, therapy, and substance abuse treatment with patient but patient continued to declined. Patient is adamant about leaving and refuses further treatment at this time.   No evidence of imminent danger to self or others at this time. Patient does not meet criteria for psychiatric admission or IVC. Supportive therapy provided about ongoing stressors. Discussed crisis plan, callling [phone] or going to Emergency Dept    Psychiatric Specialty Exam  Presentation  General Appearance:Appropriate for Environment  Eye Contact:Good  Speech:Clear and Coherent  Speech Volume:Normal  Handedness:Right   Mood and Affect  Mood:Depressed  Affect:Congruent   Thought Process  Thought Processes:Coherent  Descriptions of Associations:Intact  Orientation:Full (Time, Place and Person)  Thought Content:WDL    Hallucinations:None  Ideas of Reference:None  Suicidal Thoughts:No  Homicidal Thoughts:No   Sensorium  Memory:Immediate Good; Recent Good; Remote Good  Judgment:Fair  Insight:Good   Executive Functions  Concentration:Good  Attention Span:Good  Recall:Good  Fund of Knowledge:Good  Language:Good   Psychomotor Activity  Psychomotor Activity:Normal   Assets  Assets:Communication  Skills; Desire for Improvement; Housing; Physical Health   Sleep  Sleep:Poor  Number of hours: No data recorded  No data recorded  Physical Exam: Physical Exam Vitals and nursing note reviewed.  Constitutional:      General: She is not in acute distress.    Appearance: She is well-developed.  HENT:     Head: Normocephalic and atraumatic.  Eyes:     Conjunctiva/sclera: Conjunctivae normal.  Cardiovascular:     Rate and Rhythm: Normal rate.  Pulmonary:     Effort: Pulmonary effort is normal. No respiratory distress.  Abdominal:     Palpations: Abdomen is soft.     Tenderness: There is no abdominal tenderness.  Musculoskeletal:        General: No swelling.     Cervical back: Neck supple.  Skin:    General: Skin is warm and dry.     Capillary Refill: Capillary refill takes less than 2 seconds.  Neurological:     Mental Status: She is alert and oriented to person, place, and time.  Psychiatric:        Attention and Perception: Attention and perception normal.        Mood and Affect: Mood is anxious. Affect is tearful.        Speech: Speech normal.        Behavior: Behavior normal. Behavior is cooperative.        Thought Content: Thought content normal.        Cognition and Memory: Cognition normal.   Review of Systems  Constitutional: Negative.   Eyes: Negative.   Respiratory: Negative.    Cardiovascular: Negative.   Gastrointestinal: Negative.   Genitourinary: Negative.   Musculoskeletal: Negative.   Skin: Negative.   Neurological: Negative.   Endo/Heme/Allergies: Negative.   Psychiatric/Behavioral:  Positive for depression and substance abuse. The patient is nervous/anxious.   Blood pressure (!) 139/99, pulse 88, temperature 98.9 F (37.2 C), temperature source Tympanic, resp. rate 20, SpO2 100 %. There is no height or weight on file to calculate BMI.  Musculoskeletal: Strength & Muscle Tone: within normal limits Gait & Station: normal Patient leans:  Right   [place] MSE Discharge Disposition for Follow up and Recommendations: Discuss continuous assessment with patient. Patient refused recommendation for admission to [facility] for continuous assessment. She says she wants to go home and be with her children because she doesn't want them to wake up and both parents are not home. This Probation officer discussed the benefit of continuous assessment, medication, therapy, and substance abuse treatment with patient but patient continued to declined. Patient is adamant about leaving and refuses further treatment at this time.   No evidence of imminent danger to self or others at this time. Patient does not meet criteria for psychiatric admission or IVC. Supportive therapy provided about ongoing stressors. Discussed crisis plan, callling [phone] or going to Emergency Dept   Ophelia Shoulder, NP 08/14/2021, 4:16 AM
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
## 2021-08-31 MED ORDER — TRIAMCINOLONE ACETONIDE 40 MG/ML IJ SUSP
40.0000 mg | INTRAMUSCULAR | Status: AC | PRN
  Administered 2021-08-12: 40 mg via INTRA_ARTICULAR

## 2021-08-31 MED ORDER — BUPIVACAINE HCL 0.25 % IJ SOLN
5.0000 mL | INTRAMUSCULAR | Status: AC | PRN
  Administered 2021-08-12: 5 mL via INTRA_ARTICULAR

## 2021-09-01 ENCOUNTER — Telehealth (HOSPITAL_COMMUNITY): Payer: Self-pay | Admitting: Internal Medicine

## 2021-09-01 NOTE — BH Assessment (Signed)
Care Management - BHUC Follow Up Discharges   Writer attempted to make contact with patient today and was unsuccessful.  Voicemail is not set up.  Per chart review, patient signed AMA paperwork and was given outpatient resources.

## 2021-09-06 ENCOUNTER — Telehealth: Payer: Self-pay | Admitting: Orthopaedic Surgery

## 2021-09-06 NOTE — Telephone Encounter (Signed)
Called patient left message to return call per her mychart message for her shoulder pain and tingling

## 2021-09-07 ENCOUNTER — Encounter: Payer: Self-pay | Admitting: Radiology

## 2022-05-12 ENCOUNTER — Ambulatory Visit: Payer: Medicaid Other | Admitting: Orthopaedic Surgery

## 2022-05-18 ENCOUNTER — Ambulatory Visit (INDEPENDENT_AMBULATORY_CARE_PROVIDER_SITE_OTHER): Payer: Medicaid Other | Admitting: Orthopaedic Surgery

## 2022-05-18 DIAGNOSIS — M25512 Pain in left shoulder: Secondary | ICD-10-CM | POA: Diagnosis not present

## 2022-05-18 DIAGNOSIS — G8929 Other chronic pain: Secondary | ICD-10-CM

## 2022-05-18 NOTE — Progress Notes (Signed)
Office Visit Note   Patient: Deanna Green           Date of Birth: [birth year]           MRN: [id number] Visit Date: 05/18/2022              Requested by: Nolene Ebbs, MD [address] [place],  [place] [address] PCP: Nolene Ebbs, MD   Assessment & Plan: Visit Diagnoses:  1. Chronic left shoulder pain     Plan: Patient is 42 year old female with recurrent left shoulder pain.  She had an MRI last year that was basically unremarkable.  Does not present like a frozen shoulder.  Pain seems to be out of proportion with objective findings.  She did get really good relief for about 3 to 6 months from the last injection.  I do not see any structural problems however the MRI was without contrast therefore we will order an MR arthrogram to be thorough and to rule out labral pathology.  Follow-up after the MRI.  Follow-Up Instructions: No follow-ups on file.   Orders:  Orders Placed This Encounter  Procedures   MR Shoulder Left w/ contrast   Arthrogram   No orders of the defined types were placed in this encounter.     Procedures: No procedures performed   Clinical Data: No additional findings.   Subjective: Chief Complaint  Patient presents with   Left Shoulder - Pain    HPI  Patient comes in today for evaluation of recurrent left shoulder pain.  We saw her last year in June and she tried a glenohumeral injection which helped really well until couple months ago.  She states that she is having trouble sleeping at night and feels like her arm is weak.  Works as a Engineer, agricultural.  Denies any injuries.  Review of Systems  Constitutional: Negative.   HENT: Negative.    Eyes: Negative.   Respiratory: Negative.    Cardiovascular: Negative.   Endocrine: Negative.   Musculoskeletal: Negative.   Neurological: Negative.   Hematological: Negative.   Psychiatric/Behavioral: Negative.    All other systems reviewed and are negative.    Objective: Vital Signs: There were no  vitals taken for this visit.  Physical Exam Vitals and nursing note reviewed.  Constitutional:      Appearance: She is well-developed.  HENT:     Head: Normocephalic and atraumatic.  Pulmonary:     Effort: Pulmonary effort is normal.  Abdominal:     Palpations: Abdomen is soft.  Musculoskeletal:     Cervical back: Neck supple.  Skin:    General: Skin is warm.     Capillary Refill: Capillary refill takes less than 2 seconds.  Neurological:     Mental Status: She is alert and oriented to person, place, and time.  Psychiatric:        Behavior: Behavior normal.        Thought Content: Thought content normal.        Judgment: Judgment normal.     Ortho Exam  Examination left shoulder shows guarding to range of motion but unable to attain full passive range of motion all planes.  There is no focal motor or sensory deficits.  Negative Spurling's.  Specialty Comments:  No specialty comments available.  Imaging: No results found.   PMFS History: Patient Active Problem List   Diagnosis Date Noted   Tobacco use disorder 04/28/2018   Alcohol abuse    Hypertriglyceridemia    Mood disorder  in conditions classified elsewhere    Chronic nonintractable headache    Pancreatitis 04/27/2018   Past Medical History:  Diagnosis Date   Asthma    Bipolar disorder ([place])    Migraine     No family history on file.  No past surgical history on file. Social History   Occupational History   Not on file  Tobacco Use   Smoking status: Every Day   Smokeless tobacco: Never  Substance and Sexual Activity   Alcohol use: Yes   Drug use: No   Sexual activity: Not on file

## 2022-05-20 ENCOUNTER — Encounter: Payer: Self-pay | Admitting: Orthopaedic Surgery

## 2022-05-20 ENCOUNTER — Other Ambulatory Visit: Payer: Self-pay | Admitting: Orthopaedic Surgery

## 2022-05-20 ENCOUNTER — Other Ambulatory Visit: Payer: Self-pay

## 2022-05-20 DIAGNOSIS — G8929 Other chronic pain: Secondary | ICD-10-CM

## 2022-05-20 MED ORDER — TRAMADOL HCL 50 MG PO TABS: 50.0000 mg | ORAL_TABLET | Freq: Two times a day (BID) | ORAL | 0 refills | Status: DC | PRN

## 2022-05-23 ENCOUNTER — Telehealth: Payer: Self-pay

## 2022-05-23 ENCOUNTER — Other Ambulatory Visit: Payer: Self-pay | Admitting: Orthopaedic Surgery

## 2022-05-23 MED ORDER — ACETAMINOPHEN-CODEINE 300-30 MG PO TABS: 1.0000 | ORAL_TABLET | Freq: Every day | ORAL | 0 refills | Status: AC | PRN

## 2022-05-23 NOTE — Telephone Encounter (Signed)
Spoke with other [employer]. She picked up 5 days worth of tramadol yesterday. I verbally cancelled the Tylenol #3 script at the [facility].

## 2022-05-23 NOTE — Telephone Encounter (Signed)
Message thread I read patient stating she could not pick up tramadol.  If that is the case, she can get they tylenol 3.  If she did pick it up and is not being honest with [place], she cannot get they tylenol 3

## 2022-05-23 NOTE — Telephone Encounter (Signed)
Lauren, can you figure out what the issue is?  Thank you.

## 2022-05-23 NOTE — Telephone Encounter (Signed)
[facility] called in stating that patient just picked up tramadol on the 8th. Pharmacist would like to know if its okay to give her tylenol 3 now.

## 2022-05-24 NOTE — Telephone Encounter (Signed)
Ok, thanks.

## 2022-05-29 ENCOUNTER — Other Ambulatory Visit: Payer: Self-pay | Admitting: Orthopaedic Surgery

## 2022-05-29 MED ORDER — IBUPROFEN 800 MG PO TABS: 800.0000 mg | ORAL_TABLET | Freq: Three times a day (TID) | ORAL | 2 refills | Status: AC | PRN

## 2022-06-13 ENCOUNTER — Other Ambulatory Visit: Payer: Self-pay | Admitting: Orthopaedic Surgery

## 2022-06-13 MED ORDER — TRAMADOL HCL 50 MG PO TABS: 50.0000 mg | ORAL_TABLET | Freq: Two times a day (BID) | ORAL | 0 refills | Status: AC | PRN

## 2022-06-16 ENCOUNTER — Telehealth: Payer: Self-pay | Admitting: *Deleted

## 2022-06-16 ENCOUNTER — Telehealth: Payer: Self-pay

## 2022-06-16 ENCOUNTER — Other Ambulatory Visit: Payer: Self-pay

## 2022-06-16 DIAGNOSIS — G8929 Other chronic pain: Secondary | ICD-10-CM

## 2022-06-16 DIAGNOSIS — M542 Cervicalgia: Secondary | ICD-10-CM

## 2022-06-16 NOTE — Telephone Encounter (Signed)
-----   Message from Leandrew Koyanagi, MD sent at 06/16/2022  3:03 PM EDT ----- Regarding: MRI C spine approved I was able to get the denial on the C spine MRI overturned when I spoke to their orthopedic surgeon but the MR arthrogram of the shoulder is still denied which I think is totally reasonable since she's already had a recent shoulder MRI.  The approval should be faxed to [place] within 24 hours but if not, please call [phone].  Please let patient know that she's not going to have to go to PT for her neck.  Thanks.

## 2022-06-16 NOTE — Telephone Encounter (Signed)
Tried to call patient. No answer. LMOM. Dr.Xu spoke with insurance concerning MRI of shoulder and neck. Both were denied by insurance. They are insisting patient go to PT for her neck. I left all this information on her voicemail and ordered PT.

## 2022-06-16 NOTE — Telephone Encounter (Signed)
Pt aware of the turn around of MRI CSP and to go ahead and have this done. Pt is scheduled to have MRI cervical spine on April 9

## 2022-06-21 ENCOUNTER — Other Ambulatory Visit: Payer: Medicaid Other

## 2022-06-21 ENCOUNTER — Inpatient Hospital Stay: Admission: RE | Admit: 2022-06-21 | Payer: Medicaid Other | Source: Ambulatory Visit

## 2022-06-23 ENCOUNTER — Ambulatory Visit: Payer: Medicaid Other | Admitting: Orthopaedic Surgery

## 2023-12-05 IMAGING — MR MR SHOULDER*L* W/O CM
4 of 5 series · 20 of 40 positions shown · non-contrast
Comparison: Radiographs 05/31/2021.

CLINICAL DATA: Left shoulder pain with limited range of motion,
weakness and numbness for 1 year. No acute injury or prior relevant
surgery.

EXAM:
MRI OF THE LEFT SHOULDER WITHOUT CONTRAST
TECHNIQUE: Multiplanar, multisequence MR imaging of the shoulder was performed.
No intravenous contrast was administered.

[Series 6: PD fat-sat · axial · left · 4.5mm · 0.44mm/px · z∈[-41,+62]mm · 8 of 20 slices shown (1 of 2)]
[im 1/20]
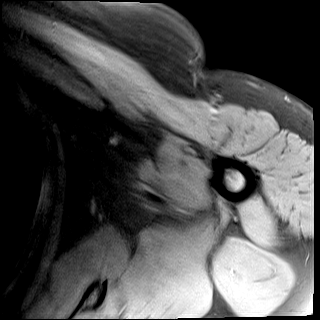
[im 3/20]
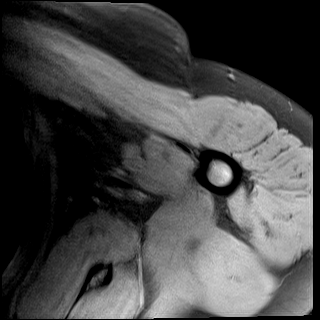
[im 6/20]
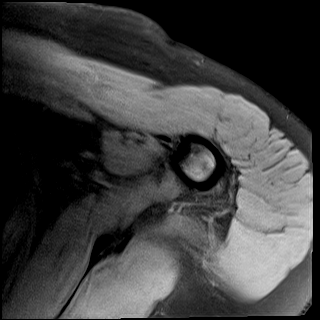
[im 9/20]
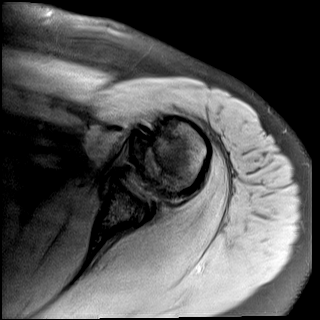
[im 11/20]
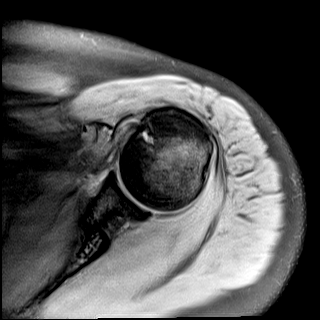
[im 14/20]
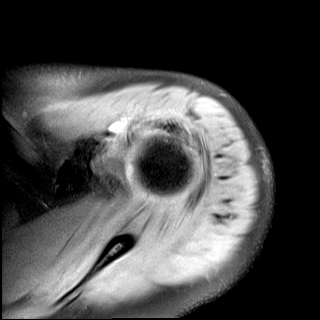
[im 17/20]
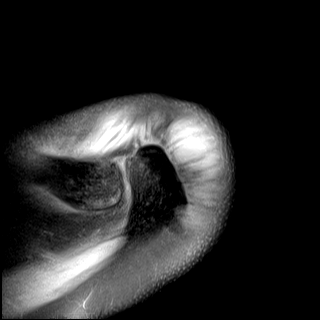
[im 20/20]
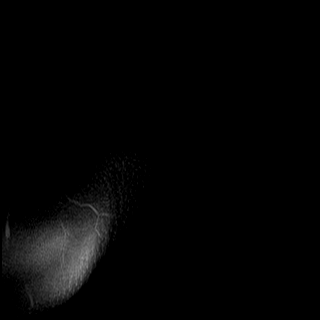

[Series 7: T2 fat-sat · coronal · left · 4.0mm · 0.22mm/px · 3 of 18 slices shown (1 of 2)]
[im 3/18]
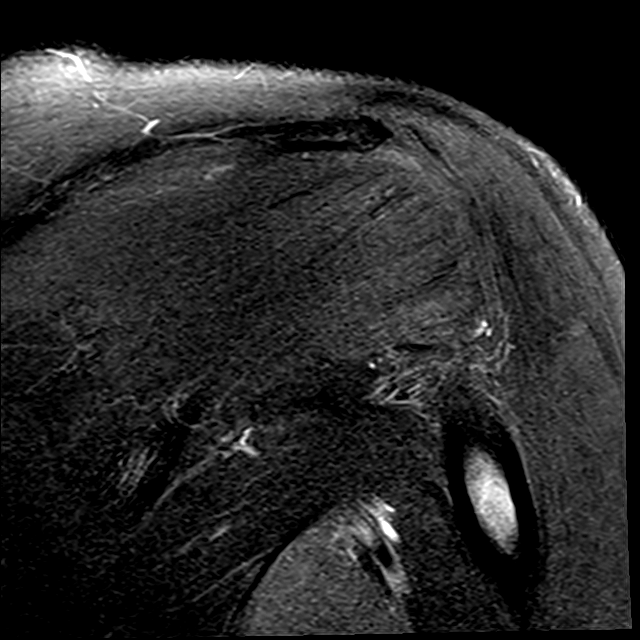
[im 10/18]
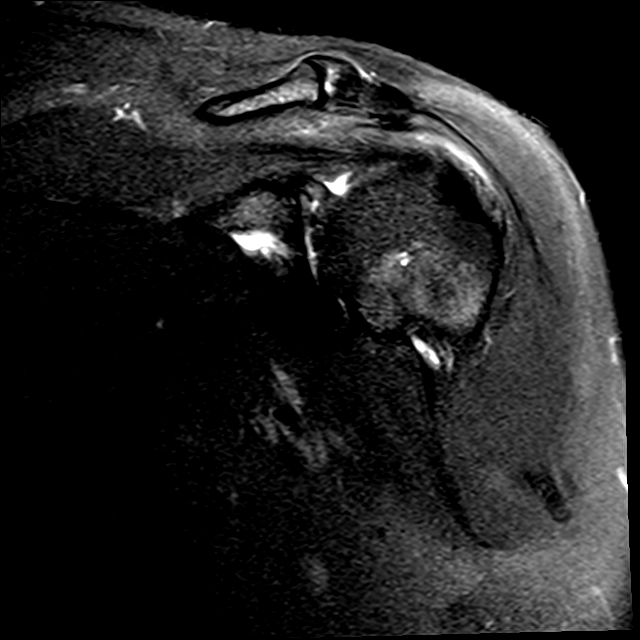
[im 15/18]
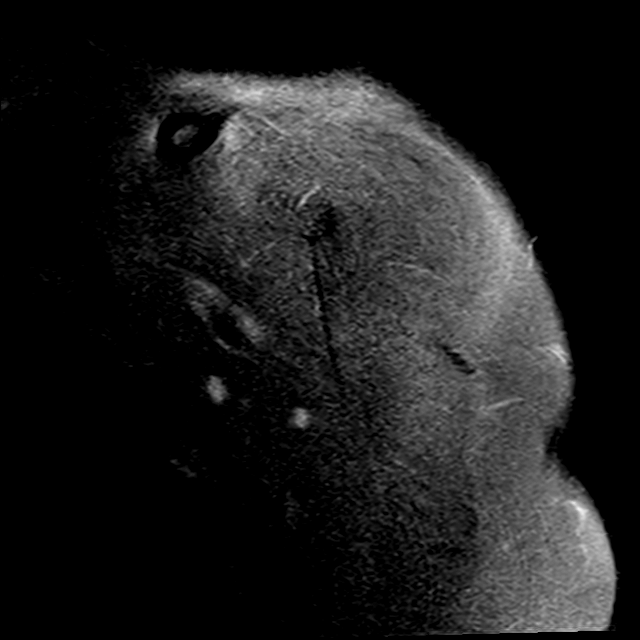

[Series 8: PD fat-sat · coronal · left · 4.0mm · 0.22mm/px · 6 of 18 slices shown (2 of 2)]
[im 1/18]
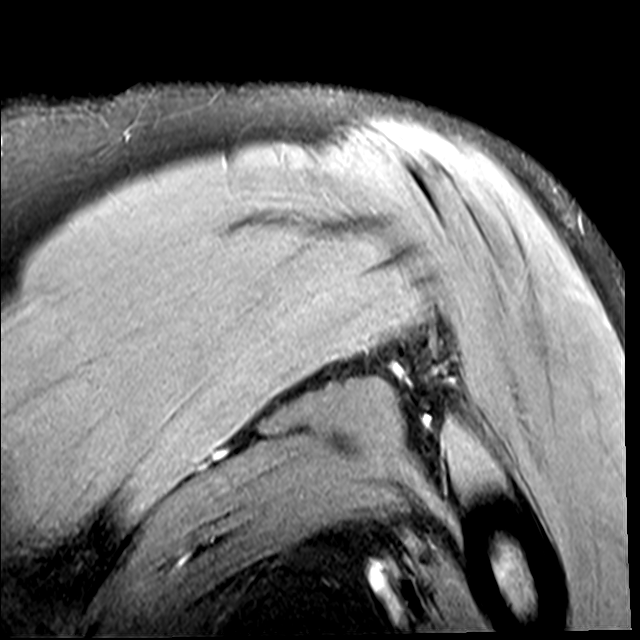
[im 3/18]
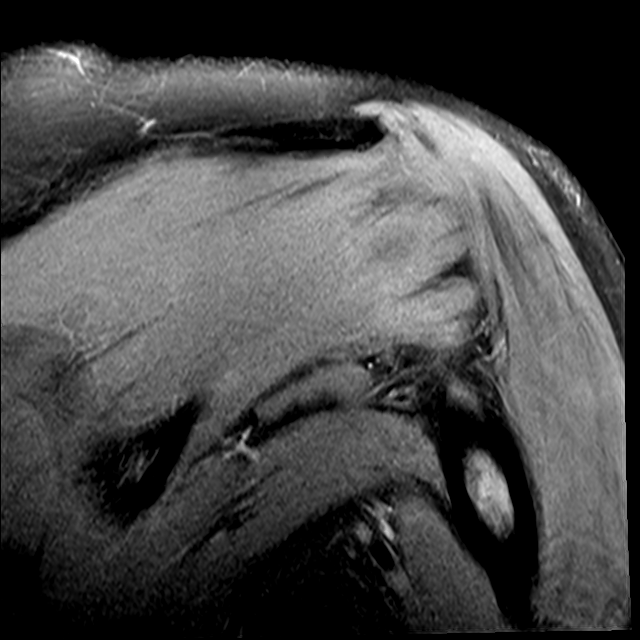
[im 5/18]
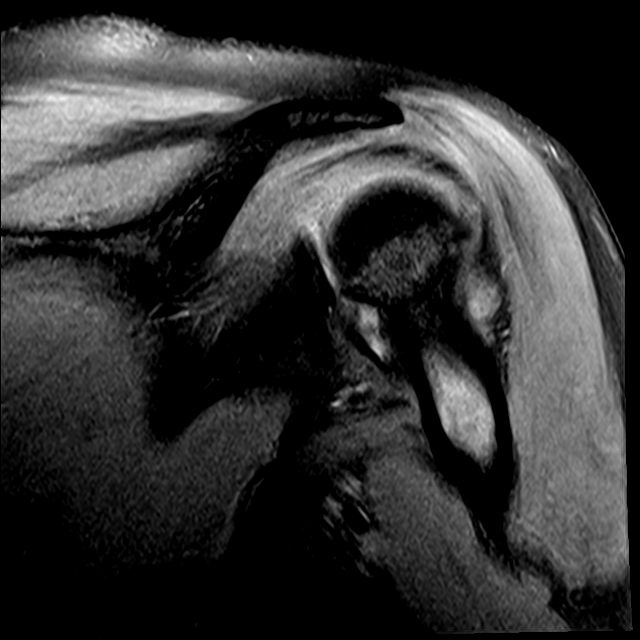
[im 8/18]
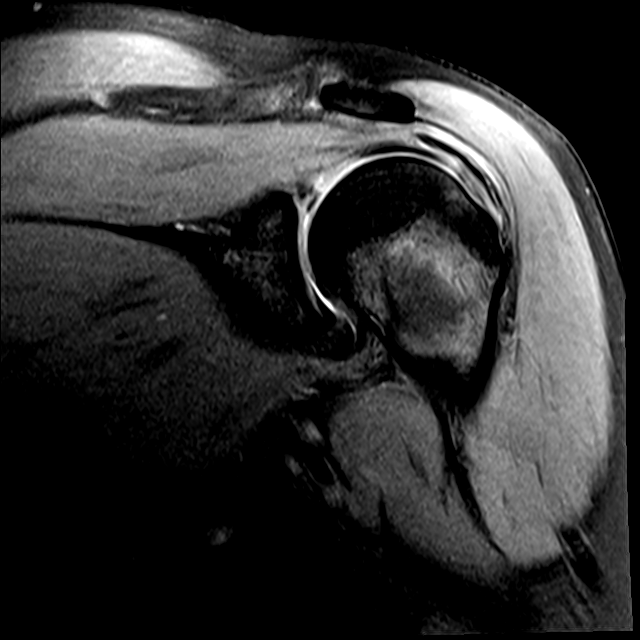
[im 10/18]
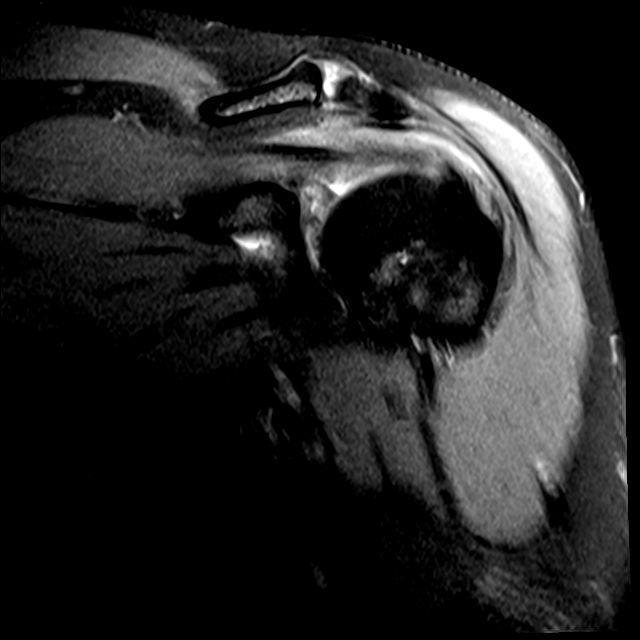
[im 15/18]
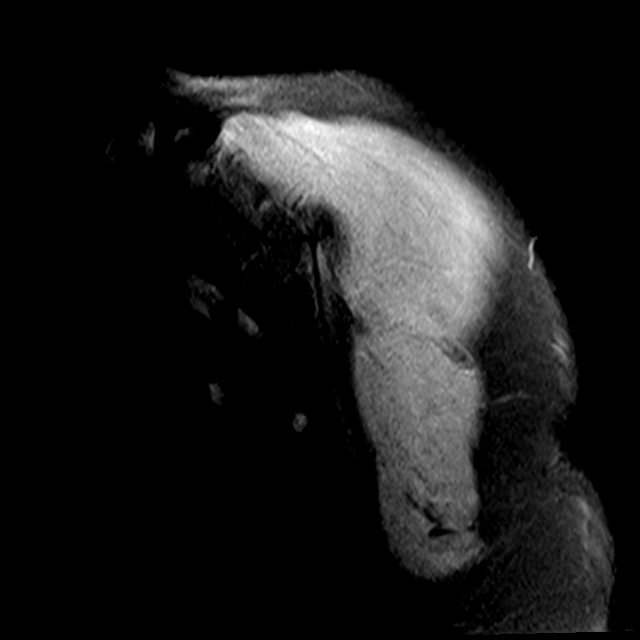

[Series 9: T2 fat-sat · oblique · left · 4.0mm · 0.44mm/px · 3 of 20 slices shown (2 of 2)]
[im 3/20]
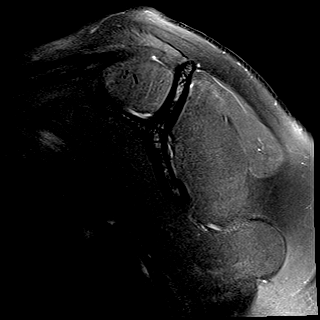
[im 11/20]
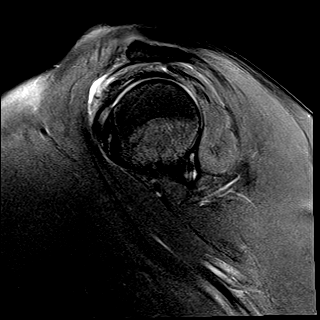
[im 17/20]
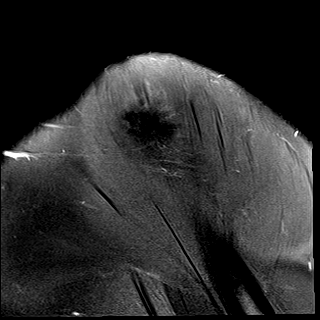

[20 of 40 positions shown; findings below may reference images not displayed]

FINDINGS: Rotator cuff:  Intact without significant tendinosis.

Muscles:  No focal muscular atrophy or edema.

Biceps long head: Evaluation of the intra-articular portion of the
biceps tendon is limited by incomplete external rotation at the
shoulder. The biceps tendon is normally located distally in the
bicipital groove, although there is probable tendinosis of the
intra-articular portion.

Acromioclavicular Joint: The acromion is type 1. There are minimal
acromioclavicular degenerative changes. A small amount of fluid is
present anteriorly in the subacromial-subdeltoid bursa with soft
tissue edema in the rotator interval.

Glenohumeral Joint: No significant shoulder joint effusion or
glenohumeral arthropathy.

Labrum: Labral assessment is limited by the lack of joint fluid. The
superior labrum appears blunted and mildly irregular on the coronal
images. The anterior labrum appears normal. No evidence of
paralabral cyst.

Bones: No acute or significant extra-articular osseous findings.Mild
subcortical cyst formation adjacent to the superior aspect of the
bicipital groove.

Other: Joint capsular thickening and edema anteriorly in the rotator
interval as can be seen with adhesive capsulitis. Mild nonspecific
joint capsular thickening in the axillary recess.
IMPRESSION: 1. Findings suggestive of adhesive capsulitis.
2. Possible bicipital tendinosis without evidence of tear.
3. Irregular blunting of the superior labrum which could reflect a
tear. This could be further evaluated with MR arthrography as
clinically warranted.
4. The rotator cuff appears intact.
